# Patient Record
Sex: Female | Born: 1950 | Race: White | Hispanic: No | Marital: Married | Smoking: Never smoker
Health system: Southern US, Community
[De-identification: ages and names within clinical notes are randomized; demographics above are authoritative.]

## PROBLEM LIST (undated history)

## (undated) DIAGNOSIS — R32 Unspecified urinary incontinence: Secondary | ICD-10-CM

## (undated) DIAGNOSIS — R42 Dizziness and giddiness: Secondary | ICD-10-CM

## (undated) DIAGNOSIS — K30 Functional dyspepsia: Secondary | ICD-10-CM

## (undated) DIAGNOSIS — M1990 Unspecified osteoarthritis, unspecified site: Secondary | ICD-10-CM

## (undated) DIAGNOSIS — K219 Gastro-esophageal reflux disease without esophagitis: Secondary | ICD-10-CM

## (undated) DIAGNOSIS — Z9049 Acquired absence of other specified parts of digestive tract: Secondary | ICD-10-CM

## (undated) DIAGNOSIS — G4730 Sleep apnea, unspecified: Secondary | ICD-10-CM

## (undated) HISTORY — DX: Functional dyspepsia: K30

## (undated) HISTORY — PX: SHOULDER ACROMIOPLASTY: SHX6093

## (undated) HISTORY — DX: Dizziness and giddiness: R42

## (undated) HISTORY — PX: WRIST SURGERY: SHX841

## (undated) HISTORY — DX: Unspecified urinary incontinence: R32

## (undated) HISTORY — DX: Sleep apnea, unspecified: G47.30

## (undated) HISTORY — DX: Acquired absence of other specified parts of digestive tract: Z90.49

## (undated) HISTORY — DX: Gastro-esophageal reflux disease without esophagitis: K21.9

## (undated) HISTORY — PX: APPENDECTOMY: SHX54

## (undated) HISTORY — PX: CHOLECYSTECTOMY: SHX55

## (undated) HISTORY — DX: Unspecified osteoarthritis, unspecified site: M19.90

## (undated) HISTORY — PX: KNEE SURGERY: SHX244

---

## 1980-05-27 HISTORY — PX: ABDOMINAL HYSTERECTOMY: SHX81

## 2006-11-12 ENCOUNTER — Ambulatory Visit: Payer: Self-pay | Admitting: Orthopaedic Surgery

## 2006-12-11 ENCOUNTER — Ambulatory Visit: Payer: Self-pay | Admitting: Orthopaedic Surgery

## 2007-02-05 ENCOUNTER — Ambulatory Visit: Payer: Self-pay | Admitting: Internal Medicine

## 2007-02-06 ENCOUNTER — Ambulatory Visit: Payer: Self-pay | Admitting: General Surgery

## 2008-03-13 ENCOUNTER — Emergency Department: Payer: Self-pay | Admitting: Emergency Medicine

## 2008-04-23 ENCOUNTER — Ambulatory Visit: Payer: Self-pay

## 2008-07-04 ENCOUNTER — Ambulatory Visit: Payer: Self-pay | Admitting: Sports Medicine

## 2009-03-23 ENCOUNTER — Ambulatory Visit: Payer: Self-pay | Admitting: Orthopaedic Surgery

## 2009-08-28 ENCOUNTER — Ambulatory Visit: Payer: Self-pay | Admitting: Unknown Physician Specialty

## 2010-05-02 ENCOUNTER — Ambulatory Visit: Payer: Self-pay | Admitting: Specialist

## 2013-07-09 ENCOUNTER — Ambulatory Visit: Payer: Self-pay | Admitting: Physician Assistant

## 2013-07-09 ENCOUNTER — Emergency Department: Payer: Self-pay | Admitting: Emergency Medicine

## 2013-07-09 LAB — COMPREHENSIVE METABOLIC PANEL
ALBUMIN: 4.1 g/dL (ref 3.4–5.0)
ANION GAP: 4 — AB (ref 7–16)
Alkaline Phosphatase: 112 U/L
BILIRUBIN TOTAL: 0.4 mg/dL (ref 0.2–1.0)
BUN: 11 mg/dL (ref 7–18)
CHLORIDE: 105 mmol/L (ref 98–107)
CO2: 30 mmol/L (ref 21–32)
CREATININE: 0.77 mg/dL (ref 0.60–1.30)
Calcium, Total: 9 mg/dL (ref 8.5–10.1)
EGFR (Non-African Amer.): >60
Glucose: 90 mg/dL (ref 65–99)
Osmolality: 276 (ref 275–301)
Potassium: 3.9 mmol/L (ref 3.5–5.1)
SGOT(AST): 24 U/L (ref 15–37)
SGPT (ALT): 35 U/L (ref 12–78)
SODIUM: 139 mmol/L (ref 136–145)
Total Protein: 7.9 g/dL (ref 6.4–8.2)

## 2013-07-09 LAB — CBC
HCT: 41 % (ref 35.0–47.0)
HGB: 14 g/dL (ref 12.0–16.0)
MCH: 31.3 pg (ref 26.0–34.0)
MCHC: 34.3 g/dL (ref 32.0–36.0)
MCV: 91 fL (ref 80–100)
Platelet: 214 10*3/uL (ref 150–440)
RBC: 4.49 10*6/uL (ref 3.80–5.20)
RDW: 13 % (ref 11.5–14.5)
WBC: 7.7 10*3/uL (ref 3.6–11.0)

## 2013-07-09 LAB — TROPONIN I
Troponin-I: <0.02 ng/mL
Troponin-I: <0.02 ng/mL

## 2014-10-28 ENCOUNTER — Ambulatory Visit: Payer: BLUE CROSS/BLUE SHIELD | Attending: Internal Medicine

## 2014-10-28 DIAGNOSIS — G4733 Obstructive sleep apnea (adult) (pediatric): Secondary | ICD-10-CM | POA: Diagnosis not present

## 2014-11-14 ENCOUNTER — Encounter: Payer: Self-pay | Admitting: Family Medicine

## 2014-11-14 ENCOUNTER — Ambulatory Visit (INDEPENDENT_AMBULATORY_CARE_PROVIDER_SITE_OTHER): Payer: BLUE CROSS/BLUE SHIELD | Admitting: Family Medicine

## 2014-11-14 VITALS — BP 153/93 | HR 76 | Temp 98.2°F | Resp 16 | Ht 65.0 in | Wt 195.8 lb

## 2014-11-14 DIAGNOSIS — IMO0001 Reserved for inherently not codable concepts without codable children: Secondary | ICD-10-CM

## 2014-11-14 DIAGNOSIS — R7309 Other abnormal glucose: Secondary | ICD-10-CM | POA: Diagnosis not present

## 2014-11-14 DIAGNOSIS — N3946 Mixed incontinence: Secondary | ICD-10-CM | POA: Diagnosis not present

## 2014-11-14 DIAGNOSIS — R32 Unspecified urinary incontinence: Secondary | ICD-10-CM | POA: Insufficient documentation

## 2014-11-14 DIAGNOSIS — G4733 Obstructive sleep apnea (adult) (pediatric): Secondary | ICD-10-CM | POA: Diagnosis not present

## 2014-11-14 DIAGNOSIS — R030 Elevated blood-pressure reading, without diagnosis of hypertension: Secondary | ICD-10-CM

## 2014-11-14 DIAGNOSIS — E669 Obesity, unspecified: Secondary | ICD-10-CM | POA: Diagnosis not present

## 2014-11-14 DIAGNOSIS — K219 Gastro-esophageal reflux disease without esophagitis: Secondary | ICD-10-CM

## 2014-11-14 DIAGNOSIS — E65 Localized adiposity: Secondary | ICD-10-CM | POA: Insufficient documentation

## 2014-11-14 DIAGNOSIS — F329 Major depressive disorder, single episode, unspecified: Secondary | ICD-10-CM | POA: Insufficient documentation

## 2014-11-14 DIAGNOSIS — Z6832 Body mass index (BMI) 32.0-32.9, adult: Secondary | ICD-10-CM | POA: Insufficient documentation

## 2014-11-14 DIAGNOSIS — F32A Depression, unspecified: Secondary | ICD-10-CM | POA: Insufficient documentation

## 2014-11-14 DIAGNOSIS — M1990 Unspecified osteoarthritis, unspecified site: Secondary | ICD-10-CM | POA: Insufficient documentation

## 2014-11-14 LAB — POCT GLYCOSYLATED HEMOGLOBIN (HGB A1C): HEMOGLOBIN A1C: 5.6

## 2014-11-14 MED ORDER — ESOMEPRAZOLE MAGNESIUM 20 MG PO PACK: 20.0000 mg | PACK | Freq: Every day | ORAL | Status: DC

## 2014-11-14 MED ORDER — OXYBUTYNIN CHLORIDE ER 5 MG PO TB24: 5.0000 mg | ORAL_TABLET | Freq: Every day | ORAL | Status: DC

## 2014-11-14 NOTE — Assessment & Plan Note (Signed)
Symptoms are doing well. Continue Nexium. Alarm symptoms reviewed.

## 2014-11-14 NOTE — Assessment & Plan Note (Signed)
New sleep study completed on 6/3. CPAP titration done but pt needs new machine. Office will correspond with sleep medicine to determine how patient can get new CPAP machine.

## 2014-11-14 NOTE — Progress Notes (Signed)
Subjective:    Patient ID: Amy Mcintosh, female    DOB: 1950-06-02, 64 y.o.   MRN: [id number]  HPI: Amy Mcintosh is a 64 y.o. female presenting on 11/14/2014 for Follow-up; Gastrophageal Reflux; and Sleep Apnea   Gastrophageal Reflux She complains of heartburn. She reports no abdominal pain, no chest pain, no dysphagia, no hoarse voice, no nausea or no sore throat. This is a chronic problem. The current episode started more than 1 year ago. The problem occurs rarely. The problem has been gradually improving. The heartburn does not wake her from sleep. The heartburn does not limit her activity. The heartburn doesn't change with position. The symptoms are aggravated by certain foods. Pertinent negatives include no anemia, melena or weight loss. She has tried a PPI for the symptoms.   Mixed incontinence: Started oxybutin at last visit.  Symptoms have very much improved. She is able to drink at work now without worry. Pt is trying Kegel exercises daily.    Sleep Apnea: Pt had sleep study. She has known sleep apnea but her CPAP is broken. She no longer has the machine. Sleep study included titration. She will need a new prescription for her CPAP.   Elevated BG on last labs. Will check A1c today.    Past Medical History  Diagnosis Date  . Vertigo   . Indigestion   . Incontinence   . GERD (gastroesophageal reflux disease)   . Arthritis   . Sleep apnea   . Hx of appendectomy     No current outpatient prescriptions on file prior to visit.   No current facility-administered medications on file prior to visit.    Review of Systems  Constitutional: Negative for fever, chills and weight loss.  HENT: Negative.  Negative for hoarse voice and sore throat.   Respiratory: Negative for shortness of breath.   Cardiovascular: Negative for chest pain, palpitations and leg swelling.  Gastrointestinal: Positive for heartburn. Negative for dysphagia, nausea, abdominal pain, melena and abdominal  distention.  Endocrine: Negative for cold intolerance, heat intolerance, polydipsia, polyphagia and polyuria.  Genitourinary: Negative.   Musculoskeletal: Negative.   Neurological: Negative for dizziness, numbness and headaches.  Psychiatric/Behavioral: Negative.    Per HPI unless specifically indicated above     Objective:    BP 153/93 mmHg  Pulse 76  Temp(Src) 98.2 F (36.8 C) (Oral)  Resp 16  Ht 5\' 5"  (1.651 m)  Wt 195 lb 12.8 oz (88.814 kg)  BMI 32.58 kg/m2  LMP   Wt Readings from Last 3 Encounters:  11/14/14 195 lb 12.8 oz (88.814 kg)  09/26/14 194 lb 4 oz (88.111 kg)    Physical Exam Results for orders placed or performed in visit on 11/14/14  POCT HgB A1C  Result Value Ref Range   Hemoglobin A1C 5.6       Assessment & Plan:   Problem List Items Addressed This Visit      Respiratory   Obstructive sleep apnea of adult    New sleep study completed on 6/3. CPAP titration done but pt needs new machine. Office will correspond with sleep medicine to determine how patient can get new CPAP machine.       Relevant Orders   For home use only DME continuous positive airway pressure (CPAP)     Digestive   Gastro-esophageal reflux disease without esophagitis - Primary    Symptoms are doing well. Continue Nexium. Alarm symptoms reviewed.       Relevant Medications  esomeprazole (NEXIUM) 20 MG packet     Other   Obesity   Mixed incontinence    Continue oxybutynin daily to help with symptoms. Kegels 3 sets daily to help with stress incontinence. Consider pelvic floor rehab if not improving.       Relevant Medications   oxybutynin (DITROPAN-XL) 5 MG 24 hr tablet    Other Visit Diagnoses    Elevated glucose        A1c is 5.6%- WNL. Likely due to not fasting.     Relevant Orders    POCT HgB A1C (Completed)    Elevated BP        Likely due to stressors at home. Pt will check x3 weeks and follow-up to determine if medication is needed.        Meds ordered this  encounter  Medications  . DISCONTD: esomeprazole (NEXIUM) 20 MG capsule    Sig: TK 1 C PO D    Refill:  11  . DISCONTD: oxybutynin (DITROPAN-XL) 5 MG 24 hr tablet    Sig: TK 1 T PO D    Refill:  11  . DISCONTD: esomeprazole (NEXIUM) 20 MG capsule    Sig: Take by mouth.  . DISCONTD: oxybutynin (DITROPAN-XL) 5 MG 24 hr tablet    Sig: Take by mouth.  . esomeprazole (NEXIUM) 20 MG packet    Sig: Take 20 mg by mouth daily before breakfast.    Dispense:  90 each    Refill:  3    Order Specific Question:  Supervising Provider    Answer:  Janeann Forehand [phone]  . oxybutynin (DITROPAN-XL) 5 MG 24 hr tablet    Sig: Take 1 tablet (5 mg total) by mouth daily.    Dispense:  90 tablet    Refill:  3    Order Specific Question:  Supervising Provider    Answer:  Janeann Forehand [[id number]]      Follow up plan: Return in about 3 weeks (around 12/05/2014), or if symptoms worsen or fail to improve.

## 2014-11-14 NOTE — Patient Instructions (Addendum)
Elevated BP: Likely stress today. Please have your husband check at home and record. We will recheck your BP at your next visit.   CPAP: We will find a DME company to get your CPAP set up and give you a call.

## 2014-11-14 NOTE — Assessment & Plan Note (Signed)
Continue oxybutynin daily to help with symptoms. Kegels 3 sets daily to help with stress incontinence. Consider pelvic floor rehab if not improving.

## 2014-11-17 ENCOUNTER — Telehealth: Payer: Self-pay | Admitting: Family Medicine

## 2014-11-17 NOTE — Telephone Encounter (Signed)
Spoke to [place] she advised that with result a script attached which I need to fax back with Amy's signature and they will work from that point send everything to [employer] and get covered machine for her and they will call her.

## 2014-12-12 ENCOUNTER — Ambulatory Visit (INDEPENDENT_AMBULATORY_CARE_PROVIDER_SITE_OTHER): Payer: BLUE CROSS/BLUE SHIELD | Admitting: Family Medicine

## 2014-12-12 ENCOUNTER — Encounter: Payer: Self-pay | Admitting: Family Medicine

## 2014-12-12 VITALS — BP 132/83 | HR 73 | Temp 98.1°F | Resp 16 | Ht 65.0 in | Wt 195.2 lb

## 2014-12-12 DIAGNOSIS — E669 Obesity, unspecified: Secondary | ICD-10-CM

## 2014-12-12 DIAGNOSIS — K219 Gastro-esophageal reflux disease without esophagitis: Secondary | ICD-10-CM

## 2014-12-12 DIAGNOSIS — Z1239 Encounter for other screening for malignant neoplasm of breast: Secondary | ICD-10-CM

## 2014-12-12 DIAGNOSIS — Z01419 Encounter for gynecological examination (general) (routine) without abnormal findings: Secondary | ICD-10-CM

## 2014-12-12 DIAGNOSIS — Z1211 Encounter for screening for malignant neoplasm of colon: Secondary | ICD-10-CM | POA: Diagnosis not present

## 2014-12-12 MED ORDER — ESOMEPRAZOLE MAGNESIUM 20 MG PO CPDR: 20.0000 mg | DELAYED_RELEASE_CAPSULE | Freq: Every day | ORAL | Status: DC

## 2014-12-12 NOTE — Patient Instructions (Addendum)

## 2014-12-12 NOTE — Progress Notes (Signed)
Subjective:    Patient ID: Amy Mcintosh, female    DOB: 10/31/1950, 63 y.o.   MRN: [id number]  HPI: Amy Mcintosh is a 63 y.o. female presenting on 12/12/2014 for Annual Exam and Gynecologic Exam   Gynecologic Exam The patient's pertinent negatives include no genital itching, genital lesions, genital odor, pelvic pain, vaginal bleeding or vaginal discharge. She is not pregnant. Pertinent negatives include no abdominal pain, back pain, chills, constipation, diarrhea, dysuria, fever, flank pain, frequency, headaches, hematuria, nausea, rash, urgency or vomiting. She uses hysterectomy for contraception. She is postmenopausal. Her past medical history is significant for a gynecological surgery (hysterectomy).    Pt presents for annual exam.  She has a hysterectomy in 1982 in fibroids- she is unsure if they removed the cervix. Pt last had mammogram in 2012- she has a benign breast lesion at 7 o'clock on L breast. It was last imaged in 2012. No other breast changes. Does breast self exams. Wears sunscreen. Has recently restarted her CPAP- she is resting well and feeling less fatigued during the day. Colonoscopy in 2011- per [facility] report she had a 4mm polyp and is due for return in 5 years. Pt is amenable to be referred for colonoscopy today.      Past Medical History  Diagnosis Date  . Vertigo   . Indigestion   . Incontinence   . GERD (gastroesophageal reflux disease)   . Arthritis   . Sleep apnea   . Hx of appendectomy     Current Outpatient Prescriptions on File Prior to Visit  Medication Sig  . oxybutynin (DITROPAN-XL) 5 MG 24 hr tablet Take 1 tablet (5 mg total) by mouth daily.   No current facility-administered medications on file prior to visit.    Review of Systems  Constitutional: Negative for fever, chills and fatigue.  HENT: Negative.   Respiratory: Negative for cough, chest tightness and wheezing.   Cardiovascular: Negative for chest pain and leg swelling.   Gastrointestinal: Negative for nausea, vomiting, abdominal pain, diarrhea and constipation.  Endocrine: Negative.  Negative for cold intolerance, heat intolerance, polydipsia, polyphagia and polyuria.  Genitourinary: Negative for dysuria, urgency, frequency, hematuria, flank pain, vaginal discharge, difficulty urinating and pelvic pain.  Musculoskeletal: Negative.  Negative for back pain.  Skin: Negative for rash.  Neurological: Negative for dizziness, light-headedness, numbness and headaches.  Psychiatric/Behavioral: Negative.    Per HPI unless specifically indicated above     Objective:    BP 132/83 mmHg  Pulse 73  Temp(Src) 98.1 F (36.7 C) (Oral)  Resp 16  Ht  (1.651 m)  Wt 195 lb 3.2 oz (88.542 kg)  BMI 32.48 kg/m2  Wt Readings from Last 3 Encounters:  12/12/14 195 lb 3.2 oz (88.542 kg)  11/14/14 195 lb 12.8 oz (88.814 kg)  09/26/14 194 lb 4 oz (88.111 kg)    Physical Exam  Constitutional: She is oriented to person, place, and time. She appears well-developed and well-nourished.  HENT:  Head: Normocephalic and atraumatic.  Neck: Normal range of motion. Neck supple. No thyromegaly present.  Cardiovascular: Normal rate and regular rhythm.  Exam reveals no gallop and no friction rub.   No murmur heard. Pulmonary/Chest: Breath sounds normal. No respiratory distress. She has no wheezes.  Abdominal: Soft. Bowel sounds are normal. She exhibits no distension. There is no tenderness.  Genitourinary: Vagina normal. No breast swelling, tenderness or discharge. There is no rash or tenderness on the right labia. There is no rash, tenderness or lesion  on the left labia. Right adnexum displays no mass, no tenderness and no fullness. Left adnexum displays no mass, no tenderness and no fullness. No tenderness in the vagina.  Breast nodule at  7 o'oclock on L breast, oval, freely mobile, discrete margins, non-tender.  Cervix and uterus are surgically absent.   Musculoskeletal: Normal  range of motion. She exhibits no edema or tenderness.  Lymphadenopathy:    She has no cervical adenopathy.  Neurological: She is alert and oriented to person, place, and time.  Skin: Skin is warm and dry.  Psychiatric: She has a normal mood and affect. Her behavior is normal. Judgment normal.   Results for orders placed or performed in visit on 11/14/14  POCT HgB A1C  Result Value Ref Range   Hemoglobin A1C 5.6       Assessment & Plan:   Problem List Items Addressed This Visit      Digestive   Gastro-esophageal reflux disease without esophagitis    Doing well. Pt requesting her nexium be changed from powder to capsule.       Relevant Medications   esomeprazole (NEXIUM) 20 MG capsule     Other   Obesity    Discussed healthy eating choices. Referred to [employer]bechoosemyplate.gov for help with food choices. Encouraged 150 minutes of exercise per week (30-6440minutes 3-4x weekly).   Encouraged drinking mainly water.       Other Visit Diagnoses    Well woman exam with routine gynecological exam    -  Primary    Overall doing well. Health maintenance reviewed.    Screening for breast cancer        Refer for [facility]. [facility] given.     Relevant Orders    MM Digital Screening    Screening for colon cancer        [facility] for follow-up of colon polyp found in 2011 colonoscopy by [facility].     Relevant Orders    Ambulatory referral to General Surgery       Meds ordered this encounter  Medications  . esomeprazole (NEXIUM) 20 MG capsule    Sig: Take 1 capsule (20 mg total) by mouth daily at 12 noon.    Dispense:  90 capsule    Refill:  3    Order Specific Question:  Supervising Provider    Answer:  Janeann ForehandHAWKINS JR, JAMES H [[id number]]      Follow up plan: Return in about 6 months (around 06/14/2015) for GERD check. .Marland Kitchen

## 2014-12-12 NOTE — Assessment & Plan Note (Addendum)
Discussed healthy eating choices. Referred to [employer]bechoosemyplate.gov for help with food choices. Encouraged 150 minutes of exercise per week (30-6840minutes 3-4x weekly).   Encouraged drinking mainly water.

## 2014-12-12 NOTE — Assessment & Plan Note (Signed)
Doing well. Pt requesting her nexium be changed from powder to capsule.

## 2014-12-13 ENCOUNTER — Other Ambulatory Visit: Payer: Self-pay

## 2014-12-13 ENCOUNTER — Telehealth: Payer: Self-pay | Admitting: Gastroenterology

## 2014-12-13 NOTE — Telephone Encounter (Signed)
Gastroenterology Pre-Procedure Review  Request Date: 8--29-2016 Requesting Physician: Dr. Servando SnareWOHL  PATIENT REVIEW QUESTIONS: The patient responded to the following health history questions as indicated:    1. Are you having any GI issues? no 2. Do you have a personal history of Polyps? yes (1 small polyp ) 3. Do you have a family history of Colon Cancer or Polyps? no 4. Diabetes Mellitus? no 5. Joint replacements in the past 12 months?no 6. Major health problems in the past 3 months?no 7. Any artificial heart valves, MVP, or defibrillator?no    MEDICATIONS & ALLERGIES:    Patient reports the following regarding taking any anticoagulation/antiplatelet therapy:   Plavix, Coumadin, Eliquis, Xarelto, Lovenox, Pradaxa, Brilinta, or Effient? no Aspirin? no  Patient confirms/reports the following medications:  Current Outpatient Prescriptions  Medication Sig Dispense Refill   esomeprazole (NEXIUM) 20 MG capsule Take 1 capsule (20 mg total) by mouth daily at 12 noon. 90 capsule 3   oxybutynin (DITROPAN-XL) 5 MG 24 hr tablet Take 1 tablet (5 mg total) by mouth daily. 90 tablet 3   No current facility-administered medications for this visit.    Patient confirms/reports the following allergies:  No Known Allergies  No orders of the defined types were placed in this encounter.    AUTHORIZATION INFORMATION Primary Insurance: 1D#: Group #:  Secondary Insurance: 1D#: Group #:  SCHEDULE INFORMATION: Date: 01-23-2015 Time: Location:[facility]

## 2015-01-06 ENCOUNTER — Ambulatory Visit: Payer: BLUE CROSS/BLUE SHIELD | Admitting: Family Medicine

## 2015-01-23 ENCOUNTER — Encounter: Admission: RE | Payer: Self-pay | Source: Ambulatory Visit

## 2015-01-23 ENCOUNTER — Ambulatory Visit
Admission: RE | Admit: 2015-01-23 | Payer: BLUE CROSS/BLUE SHIELD | Source: Ambulatory Visit | Admitting: Gastroenterology

## 2015-01-23 SURGERY — COLONOSCOPY WITH PROPOFOL
Anesthesia: Choice

## 2017-09-09 ENCOUNTER — Ambulatory Visit (INDEPENDENT_AMBULATORY_CARE_PROVIDER_SITE_OTHER): Payer: Medicare HMO | Admitting: Nurse Practitioner

## 2017-09-09 ENCOUNTER — Other Ambulatory Visit: Payer: Self-pay

## 2017-09-09 ENCOUNTER — Encounter: Payer: Self-pay | Admitting: Nurse Practitioner

## 2017-09-09 VITALS — BP 136/71 | HR 76 | Temp 98.4°F | Ht 65.0 in | Wt 194.6 lb

## 2017-09-09 DIAGNOSIS — R413 Other amnesia: Secondary | ICD-10-CM

## 2017-09-09 DIAGNOSIS — Z136 Encounter for screening for cardiovascular disorders: Secondary | ICD-10-CM | POA: Diagnosis not present

## 2017-09-09 DIAGNOSIS — R7309 Other abnormal glucose: Secondary | ICD-10-CM | POA: Diagnosis not present

## 2017-09-09 DIAGNOSIS — Z833 Family history of diabetes mellitus: Secondary | ICD-10-CM

## 2017-09-09 DIAGNOSIS — G4733 Obstructive sleep apnea (adult) (pediatric): Secondary | ICD-10-CM

## 2017-09-09 NOTE — Patient Instructions (Addendum)
Amy BathGlenda K Mcintosh,   Thank you for coming in to clinic today.  1. MMSE - mini mental status exam and clock draw test today = normal today. - Get a good night's rest for a couple weeks and see if memory gets better.  We will rescreen in 6 months for any changes or worsening.  2. Labs today - we will call you with results in a couple days.  Please schedule a follow-up appointment with Wilhelmina McardleLauren Lucine Bilski, AGNP. Return in about 6 months (around 03/11/2018) for Medicare wellness in 6 months with MMSE.  If you have any other questions or concerns, please feel free to call the clinic or send a message through MyChart. You may also schedule an earlier appointment if necessary.  You will receive a survey after today's visit either digitally by e-mail or paper by [employer] mail. Your experiences and feedback matter to us.  Please respond so we know how we are doing as we provide care for you.   Wilhelmina McardleLauren Jahni Nazar, DNP, AGNP-BC Adult Gerontology Nurse Practitioner [facility]

## 2017-09-09 NOTE — Progress Notes (Signed)
Subjective:    Patient ID: Amy Mcintosh, female    DOB: [birth year], 67 y.o.   MRN: [id number]  Amy Mcintosh is a 67 y.o. female presenting on 09/09/2017 for Apnea (need a order for CPAP new machine, pt need an order for replacement filters )   HPI Obstructive sleep apnea - Needs filters for replacement. Changes filters once per month.  Has new machine after last sleep study June 2016.  Current machine is working well and does not need a replacement.  Needs replacement supplies for mask, tubing also.  Pt reports a faxed order from her supply company was sent about 1 month ago.   - She sleeps well when wearing her CPAP. Wears for 6-7 hours when sleeping.  Dozes some in living room without CPAP nightly. - Is not using CPAP now r/t not having replacement filters.  Is waking up with headache that improves after being awake, but when wearing CPAP has no headache. - Also having increasing forgetfulness and clumsiness.  Has hit hand yesterday and broke two glasses.  Frustrated with memory and not being able to remember common things throughout the day.  Current supplies Resmed AirFit P10 Nasal Pillow, Small apparatus  Family history DM - Hasn't felt normal in last couple weeks and states her husband has checekd her sugar and it was 140.  Pt has family history of diabetes and would like screening.  GERD Ranitidine prn if reflux.  Social History   Tobacco Use  . Smoking status: Never Smoker  . Smokeless tobacco: Never Used  Substance Use Topics  . Alcohol use: No  . Drug use: No    Review of Systems Per HPI unless specifically indicated above     Objective:    BP 136/71 (BP Location: Right Arm, Patient Position: Sitting, Cuff Size: Normal)   Pulse 76   Temp 98.4 F (36.9 C) (Oral)   Ht 5\' 5"  (1.651 m)   Wt 194 lb 9.6 oz (88.3 kg)   BMI 32.38 kg/m   Wt Readings from Last 3 Encounters:  09/09/17 194 lb 9.6 oz (88.3 kg)  12/12/14 195 lb 3.2 oz (88.5 kg)  11/14/14 195 lb 12.8  oz (88.8 kg)    Physical Exam  Constitutional: She is oriented to person, place, and time. She appears well-developed and well-nourished. No distress.  HENT:  Head: Normocephalic and atraumatic.  Neck: Normal range of motion. Neck supple. Carotid bruit is not present.  Cardiovascular: Normal rate, regular rhythm, S1 normal, S2 normal, normal heart sounds and intact distal pulses.  Pulmonary/Chest: Effort normal and breath sounds normal. No respiratory distress.  Musculoskeletal: She exhibits no edema (pedal).  Neurological: She is alert and oriented to person, place, and time.  Skin: Skin is warm and dry.  Psychiatric: Her speech is normal and behavior is normal. Judgment and thought content normal. Cognition and memory are normal. Cognition and memory are not impaired. She exhibits a depressed mood (tearful). She exhibits normal recent memory and normal remote memory.  Vitals reviewed.   Normal Clock Draw test  MMSE - Mini Mental State Exam 09/09/2017  Orientation to time 5  Orientation to Place 4  Registration 3  Attention/ Calculation 5  Recall 3  Language- name 2 objects 2  Language- repeat 0  Language- follow 3 step command 3  Language- read & follow direction 1  Write a sentence 1  Copy design 1  Total score 28     Results for orders placed or  performed in visit on 11/14/14  POCT HgB A1C  Result Value Ref Range   Hemoglobin A1C 5.6       Assessment & Plan:   Problem List Items Addressed This Visit    None    Visit Diagnoses    Obstructive sleep apnea    -  Primary Pt with stable OSA on CPAP for 6-7 hours each night with good results.  Now is not using CPAP r/t needing replacement filters and is beginning to have symptoms of daytime sleepiness, forgetfulness, and clumsiness.  Pt is also awakening with headache that resolves after 30-60 minutes.  Plan: 1. Provide order for replacement parts to maintain CPAP machine. NO new machine is needed. 2. Encouraged pt to  get good sleep and re-evaulate other symptoms. 3. Followup as needed in 6 months with repeat MMSE.    Memory changes     Pt notes memory impairment and difficulty with word recall throughout the day.  Normal cognitive testing with MMSE and clock draw test today.  Plan: 1. Repeat MMSE in 6 months. 2. Encourage good sleep and use of CPAP for improving cognition. 3. Followup 6 months.   Relevant Orders   Hemoglobin A1c   COMPLETE METABOLIC PANEL WITH GFR   Encounter for screening for cardiovascular disorders     Pt with elevated glucose at home.  Would like screening for lipid disorder and diabetes since has family history of both.  Labs today for A1c, lipid, and cmp.   Relevant Orders   Lipid panel   Family history of diabetes mellitus       Relevant Orders   Hemoglobin A1c   Elevated glucose       Relevant Orders   Hemoglobin A1c   COMPLETE METABOLIC PANEL WITH GFR      Follow up plan: Return in about 6 months (around 03/11/2018) for Medicare wellness in 6 months with MMSE.  Wilhelmina Mcardle, DNP, AGPCNP-BC Adult Gerontology Primary Care Nurse Practitioner [facility] 09/09/2017, 5:19 PM

## 2017-09-10 ENCOUNTER — Encounter: Payer: Self-pay | Admitting: Nurse Practitioner

## 2017-09-10 DIAGNOSIS — E782 Mixed hyperlipidemia: Secondary | ICD-10-CM | POA: Insufficient documentation

## 2017-09-10 DIAGNOSIS — R748 Abnormal levels of other serum enzymes: Secondary | ICD-10-CM | POA: Insufficient documentation

## 2017-09-10 DIAGNOSIS — R7303 Prediabetes: Secondary | ICD-10-CM | POA: Insufficient documentation

## 2017-09-10 LAB — COMPLETE METABOLIC PANEL WITH GFR
AG Ratio: 1.6 (calc) (ref 1.0–2.5)
ALT: 53 U/L — ABNORMAL HIGH (ref 6–29)
AST: 61 U/L — ABNORMAL HIGH (ref 10–35)
Albumin: 4.3 g/dL (ref 3.6–5.1)
Alkaline phosphatase (APISO): 94 U/L (ref 33–130)
BUN: 11 mg/dL (ref 7–25)
CO2: 28 mmol/L (ref 20–32)
Calcium: 9.2 mg/dL (ref 8.6–10.4)
Chloride: 106 mmol/L (ref 98–110)
Creat: 0.78 mg/dL (ref 0.50–0.99)
GFR, Est African American: 92 mL/min/{1.73_m2} (ref >=60)
GFR, Est Non African American: 79 mL/min/{1.73_m2} (ref >=60)
Globulin: 2.7 g/dL (calc) (ref 1.9–3.7)
Glucose, Bld: 114 mg/dL — ABNORMAL HIGH (ref 65–99)
Potassium: 4.1 mmol/L (ref 3.5–5.3)
Sodium: 140 mmol/L (ref 135–146)
Total Bilirubin: 0.6 mg/dL (ref 0.2–1.2)
Total Protein: 7 g/dL (ref 6.1–8.1)

## 2017-09-10 LAB — LIPID PANEL
Cholesterol: 197 mg/dL (ref <200)
HDL: 40 mg/dL — ABNORMAL LOW (ref >50)
LDL Cholesterol (Calc): 133 mg/dL (calc) — ABNORMAL HIGH
Non-HDL Cholesterol (Calc): 157 mg/dL (calc) — ABNORMAL HIGH (ref <130)
Total CHOL/HDL Ratio: 4.9 (calc) (ref <5.0)
Triglycerides: 125 mg/dL (ref <150)

## 2017-09-10 LAB — HEMOGLOBIN A1C
Hgb A1c MFr Bld: 6 % of total Hgb — ABNORMAL HIGH (ref <5.7)
Mean Plasma Glucose: 126 (calc)
eAG (mmol/L): 7 (calc)

## 2017-11-20 DIAGNOSIS — G4733 Obstructive sleep apnea (adult) (pediatric): Secondary | ICD-10-CM | POA: Diagnosis not present

## 2018-03-08 DIAGNOSIS — H6691 Otitis media, unspecified, right ear: Secondary | ICD-10-CM | POA: Diagnosis not present

## 2018-03-08 DIAGNOSIS — J0100 Acute maxillary sinusitis, unspecified: Secondary | ICD-10-CM | POA: Diagnosis not present

## 2018-03-17 ENCOUNTER — Ambulatory Visit (INDEPENDENT_AMBULATORY_CARE_PROVIDER_SITE_OTHER): Payer: Medicare HMO

## 2018-03-17 VITALS — BP 152/82 | HR 84 | Temp 98.6°F | Resp 16 | Ht 65.0 in | Wt 197.0 lb

## 2018-03-17 DIAGNOSIS — Z1239 Encounter for other screening for malignant neoplasm of breast: Secondary | ICD-10-CM

## 2018-03-17 DIAGNOSIS — Z0000 Encounter for general adult medical examination without abnormal findings: Secondary | ICD-10-CM | POA: Diagnosis not present

## 2018-03-17 DIAGNOSIS — Z780 Asymptomatic menopausal state: Secondary | ICD-10-CM | POA: Diagnosis not present

## 2018-03-17 NOTE — Patient Instructions (Addendum)
Amy Mcintosh , Thank you for taking time to come for your Medicare Wellness Visit. I appreciate your ongoing commitment to your health goals. Please review the following plan we discussed and let me know if I can assist you in the future.   Screening recommendations/referrals: Colonoscopy: completed 05/2009 Mammogram: Please call [phone] to schedule your mammogram.  Bone Density: Please call [phone] to schedule with your mammogram Recommended yearly ophthalmology/optometry visit for glaucoma screening and checkup Recommended yearly dental visit for hygiene and checkup  Vaccinations: Influenza vaccine: declined Pneumococcal vaccine: declined Tdap vaccine: due, check with your insurance company for coverage Shingles vaccine: shingrix eligible, check with your insurance company for coverage   Advanced directives: Advance directive discussed with you today. Even though you declined this today please call our office should you change your mind and we can give you the proper paperwork for you to fill out.  Conditions/risks identified: Recommend drinking at least 6-8 glasses of water a day   Next appointment: Follow up in one year for your annual wellness exam.     [facility] Insurance Information Program [email or web address]     Preventive Care 65 Years and Older, Female Preventive care refers to lifestyle choices and visits with your health care provider that can promote health and wellness. What does preventive care include?  A yearly physical exam. This is also called an annual well check.  Dental exams once or twice a year.  Routine eye exams. Ask your health care provider how often you should have your eyes checked.  Personal lifestyle choices, including:  Daily care of your teeth and gums.  Regular physical activity.  Eating a healthy diet.  Avoiding tobacco and drug use.  Limiting alcohol  use.  Practicing safe sex.  Taking low-dose aspirin every day.  Taking vitamin and mineral supplements as recommended by your health care provider. What happens during an annual well check? The services and screenings done by your health care provider during your annual well check will depend on your age, overall health, lifestyle risk factors, and family history of disease. Counseling  Your health care provider may ask you questions about your:  Alcohol use.  Tobacco use.  Drug use.  Emotional well-being.  Home and relationship well-being.  Sexual activity.  Eating habits.  History of falls.  Memory and ability to understand (cognition).  Work and work Astronomer.  Reproductive health. Screening  You may have the following tests or measurements:  Height, weight, and BMI.  Blood pressure.  Lipid and cholesterol levels. These may be checked every 5 years, or more frequently if you are over 19 years old.  Skin check.  Lung cancer screening. You may have this screening every year starting at age 17 if you have a 30-pack-year history of smoking and currently smoke or have quit within the past 15 years.  Fecal occult blood test (FOBT) of the stool. You may have this test every year starting at age 51.  Flexible sigmoidoscopy or colonoscopy. You may have a sigmoidoscopy every 5 years or a colonoscopy every 10 years starting at age 76.  Hepatitis C blood test.  Hepatitis B blood test.  Sexually transmitted disease (STD) testing.  Diabetes screening. This is done by checking your blood sugar (glucose) after you have not eaten for a while (fasting). You may have this done every 1-3 years.  Bone density scan. This is done to screen for osteoporosis. You may have this done starting at age 65.  Mammogram. This may  be done every 1-2 years. Talk to your health care provider about how often you should have regular mammograms. Talk with your health care provider about  your test results, treatment options, and if necessary, the need for more tests. Vaccines  Your health care provider may recommend certain vaccines, such as:  Influenza vaccine. This is recommended every year.  Tetanus, diphtheria, and acellular pertussis (Tdap, Td) vaccine. You may need a Td booster every 10 years.  Zoster vaccine. You may need this after age 66.  Pneumococcal 13-valent conjugate (PCV13) vaccine. One dose is recommended after age 55.  Pneumococcal polysaccharide (PPSV23) vaccine. One dose is recommended after age 64. Talk to your health care provider about which screenings and vaccines you need and how often you need them. This information is not intended to replace advice given to you by your health care provider. Make sure you discuss any questions you have with your health care provider. Document Released: 06/09/2015 Document Revised: 01/31/2016 Document Reviewed: 03/14/2015 Elsevier Interactive Patient Education  2017 [employer].  Fall Prevention in the Home Falls can cause injuries. They can happen to people of all ages. There are many things you can do to make your home safe and to help prevent falls. What can I do on the outside of my home?  Regularly fix the edges of walkways and driveways and fix any cracks.  Remove anything that might make you trip as you walk through a door, such as a raised step or threshold.  Trim any bushes or trees on the path to your home.  Use bright outdoor lighting.  Clear any walking paths of anything that might make someone trip, such as rocks or tools.  Regularly check to see if handrails are loose or broken. Make sure that both sides of any steps have handrails.  Any raised decks and porches should have guardrails on the edges.  Have any leaves, snow, or ice cleared regularly.  Use sand or salt on walking paths during winter.  Clean up any spills in your garage right away. This includes oil or grease spills. What  can I do in the bathroom?  Use night lights.  Install grab bars by the toilet and in the tub and shower. Do not use towel bars as grab bars.  Use non-skid mats or decals in the tub or shower.  If you need to sit down in the shower, use a plastic, non-slip stool.  Keep the floor dry. Clean up any water that spills on the floor as soon as it happens.  Remove soap buildup in the tub or shower regularly.  Attach bath mats securely with double-sided non-slip rug tape.  Do not have throw rugs and other things on the floor that can make you trip. What can I do in the bedroom?  Use night lights.  Make sure that you have a light by your bed that is easy to reach.  Do not use any sheets or blankets that are too big for your bed. They should not hang down onto the floor.  Have a firm chair that has side arms. You can use this for support while you get dressed.  Do not have throw rugs and other things on the floor that can make you trip. What can I do in the kitchen?  Clean up any spills right away.  Avoid walking on wet floors.  Keep items that you use a lot in easy-to-reach places.  If you need to reach something above you,  use a strong step stool that has a grab bar.  Keep electrical cords out of the way.  Do not use floor polish or wax that makes floors slippery. If you must use wax, use non-skid floor wax.  Do not have throw rugs and other things on the floor that can make you trip. What can I do with my stairs?  Do not leave any items on the stairs.  Make sure that there are handrails on both sides of the stairs and use them. Fix handrails that are broken or loose. Make sure that handrails are as long as the stairways.  Check any carpeting to make sure that it is firmly attached to the stairs. Fix any carpet that is loose or worn.  Avoid having throw rugs at the top or bottom of the stairs. If you do have throw rugs, attach them to the floor with carpet tape.  Make sure  that you have a light switch at the top of the stairs and the bottom of the stairs. If you do not have them, ask someone to add them for you. What else can I do to help prevent falls?  Wear shoes that:  Do not have high heels.  Have rubber bottoms.  Are comfortable and fit you well.  Are closed at the toe. Do not wear sandals.  If you use a stepladder:  Make sure that it is fully opened. Do not climb a closed stepladder.  Make sure that both sides of the stepladder are locked into place.  Ask someone to hold it for you, if possible.  Clearly mark and make sure that you can see:  Any grab bars or handrails.  First and last steps.  Where the edge of each step is.  Use tools that help you move around (mobility aids) if they are needed. These include:  Canes.  Walkers.  Scooters.  Crutches.  Turn on the lights when you go into a dark area. Replace any light bulbs as soon as they burn out.  Set up your furniture so you have a clear path. Avoid moving your furniture around.  If any of your floors are uneven, fix them.  If there are any pets around you, be aware of where they are.  Review your medicines with your doctor. Some medicines can make you feel dizzy. This can increase your chance of falling. Ask your doctor what other things that you can do to help prevent falls. This information is not intended to replace advice given to you by your health care provider. Make sure you discuss any questions you have with your health care provider. Document Released: 03/09/2009 Document Revised: 10/19/2015 Document Reviewed: 06/17/2014 Elsevier Interactive Patient Education  2017 [employer].

## 2018-03-17 NOTE — Progress Notes (Signed)
Subjective:   Amy Mcintosh is a 67 y.o. female who presents for an Initial Medicare Annual Wellness Visit.  Review of Systems    Cardiac Risk Factors include: advanced age (>64men, >56 women);obesity (BMI >30kg/m2)     Objective:    Today's Vitals   03/17/18 0826  BP: (!) 152/82  Pulse: 84  Resp: 16  Temp: 98.6 F (37 C)  TempSrc: Oral  Weight: 197 lb (89.4 kg)  Height: 5\' 5"  (1.651 m)   Body mass index is 32.78 kg/m.  Advanced Directives 03/17/2018  Does Patient Have a Medical Advance Directive? No  Would patient like information on creating a medical advance directive? No - Patient declined    Current Medications (verified) Outpatient Encounter Medications as of 03/17/2018  Medication Sig  . cefdinir (OMNICEF) 300 MG capsule TK 1 C PO Q 12 H  . ranitidine (ZANTAC) 75 MG tablet Take 75 mg by mouth 2 (two) times daily as needed for heartburn.   No facility-administered encounter medications on file as of 03/17/2018.     Allergies (verified) Patient has no known allergies.   History: Past Medical History:  Diagnosis Date  . Arthritis   . GERD (gastroesophageal reflux disease)   . Hx of appendectomy   . Incontinence   . Indigestion   . Sleep apnea   . Vertigo    Past Surgical History:  Procedure Laterality Date  . ABDOMINAL HYSTERECTOMY  1982  . APPENDECTOMY    . CHOLECYSTECTOMY    . KNEE SURGERY Right   . SHOULDER ACROMIOPLASTY    . WRIST SURGERY Right    right and left wrist surgery    Family History  Problem Relation Age of Onset  . Liver disease Father   . Breast cancer Sister   . Bladder Cancer Sister   . Pancreatic cancer Paternal Aunt   . Breast cancer Paternal Aunt   . Breast cancer Sister    Social History   Socioeconomic History  . Marital status: Married    Spouse name: Not on file  . Number of children: Not on file  . Years of education: Not on file  . Highest education level: High school graduate  Occupational History  .  Occupation: Statistician    Comment: part time   Social Needs  . Financial resource strain: Not very hard  . Food insecurity:    Worry: Never true    Inability: Never true  . Transportation needs:    Medical: No    Non-medical: No  Tobacco Use  . Smoking status: Never Smoker  . Smokeless tobacco: Never Used  Substance and Sexual Activity  . Alcohol use: No  . Drug use: No  . Sexual activity: Not on file  Lifestyle  . Physical activity:    Days per week: 0 days    Minutes per session: 0 min  . Stress: Not at all  Relationships  . Social connections:    Talks on phone: More than three times a week    Gets together: More than three times a week    Attends religious service: More than 4 times per year    Active member of club or organization: No    Attends meetings of clubs or organizations: Never    Relationship status: Married  Other Topics Concern  . Not on file  Social History Narrative   Works 3 times a week   Church 3 times a week     Tobacco Counseling Counseling  given: Not Answered   Clinical Intake:  Pre-visit preparation completed: Yes  Pain : No/denies pain     Nutritional Status: BMI > 30  Obese Nutritional Risks: None Diabetes: No  How often do you need to have someone help you when you read instructions, pamphlets, or other written materials from your doctor or pharmacy?: 1 - Never What is the last grade level you completed in school?: high school  Interpreter Needed?: No  Information entered by :: Shakeera Rightmyer,LPN    Activities of Daily Living In your present state of health, do you have any difficulty performing the following activities: 03/17/2018  Hearing? N  Comment declines hearing aids   Vision? N  Comment wears glasses  Difficulty concentrating or making decisions? N  Walking or climbing stairs? N  Dressing or bathing? N  Doing errands, shopping? N  Preparing Food and eating ? N  Using the Toilet? N  In the past six months, have  you accidently leaked urine? Y  Comment wears pads for protection   Do you have problems with loss of bowel control? N  Managing your Medications? N  Managing your Finances? N  Housekeeping or managing your Housekeeping? N  Some recent data might be hidden     Immunizations and Health Maintenance Immunization History  Administered Date(s) Administered  . Tdap 05/28/2007   Health Maintenance Due  Topic Date Due  . Hepatitis C Screening  Jul 10, 1950  . MAMMOGRAM  12/12/2011  . DEXA SCAN  02/11/2016    Patient Care Team: Galen Manila, NP as PCP - General (Nurse Practitioner)  Indicate any recent Medical Services you may have received from other than [facility] providers in the past year (date may be approximate).     Assessment:   This is a routine wellness examination for Amy Mcintosh.  Hearing/Vision screen Vision Screening Comments: Sees Dr.Shade annually   Dietary issues and exercise activities discussed: Current Exercise Habits: The patient does not participate in regular exercise at present, Exercise limited by: None identified  Goals    . DIET - INCREASE WATER INTAKE     Recommend drinking at least 6-8 glasses of water a day       Depression Screen PHQ 2/9 Scores 03/17/2018  PHQ - 2 Score 0    Fall Risk Fall Risk  03/17/2018  Falls in the past year? No    FALL RISK PREVENTION PERTAINING TO THE HOME:  Any stairs in or around the home WITH handrails? No  Home free of loose throw rugs in walkways, pet beds, electrical cords, etc? Yes  Adequate lighting in your home to reduce risk of falls? Yes   ASSISTIVE DEVICES UTILIZED TO PREVENT FALLS:  Life alert? No  Use of a cane, walker or w/c? No  Grab bars in the bathroom? No  Shower chair or bench in shower? Yes  Elevated toilet seat or a handicapped toilet? Yes   DME ORDERS:  DME order needed?  No   TIMED UP AND GO:  Was the test performed? yes Length of time to ambulate 10 feet: 8 sec.    GAIT:  Appearance of gait: Gait stead-fast without the use of an assistive device.  Education: Fall risk prevention has been discussed.  Intervention(s) required? No    Cognitive Function: MMSE - Mini Mental State Exam 09/09/2017  Orientation to time 5  Orientation to Place 4  Registration 3  Attention/ Calculation 5  Recall 3  Language- name 2 objects 2  Language- repeat  0  Language- follow 3 step command 3  Language- read & follow direction 1  Write a sentence 1  Copy design 1  Total score 28        Screening Tests Health Maintenance  Topic Date Due  . Hepatitis C Screening  March 05, 1951  . MAMMOGRAM  12/12/2011  . DEXA SCAN  02/11/2016  . INFLUENZA VACCINE  01/26/2019 (Originally 12/25/2017)  . TETANUS/TDAP  03/18/2019 (Originally 05/27/2017)  . PNA vac Low Risk Adult (1 of 2 - PCV13) 03/18/2019 (Originally 02/11/2016)  . COLONOSCOPY  12/12/2019    Qualifies for Shingles Vaccine? Yes  Due for Shingrix. Education has been provided regarding the importance of this vaccine. Pt has been advised to call insurance company to determine out of pocket expense. Advised may also receive vaccine at local pharmacy or Health Dept. Verbalized acceptance and understanding.  Tdap: Although this vaccine is not a covered service during a Wellness Exam, does the patient still wish to receive this vaccine today?  No .  Education has been provided regarding the importance of this vaccine. Advised may receive this vaccine at local pharmacy or Health Dept. Aware to provide a copy of the vaccination record if obtained from local pharmacy or Health Dept. Verbalized acceptance and understanding.  Flu Vaccine: Due for Flu vaccine. Does the patient want to receive this vaccine today?  No . Education has been provided regarding the importance of this vaccine but still declined. Advised may receive this vaccine at local pharmacy or Health Dept. Aware to provide a copy of the vaccination record if obtained  from local pharmacy or Health Dept. Verbalized acceptance and understanding.  Pneumococcal Vaccine: Due for Pneumococcal vaccine. Does the patient want to receive this vaccine today?  No . Education has been provided regarding the importance of this vaccine but still declined. Advised may receive this vaccine at local pharmacy or Health Dept. Aware to provide a copy of the vaccination record if obtained from local pharmacy or Health Dept. Verbalized acceptance and understanding.  Cancer Screenings:  Colorectal Screening: Completed 11/2009. Repeat every 10 years  Mammogram: due, Ordered today. Pt provided with contact info and advised to call to schedule appt.  Bone Density: Ordered today. Pt provided with contact info and advised to call to schedule appt.   Lung Cancer Screening: (Low Dose CT Chest recommended if Age 51-80 years, 30 pack-year currently smoking OR have quit w/in 15years.) does not qualify.     Additional Screening:  Hepatitis C Screening: does qualify, will order for next lab draw  Vision Screening: Recommended annual ophthalmology exams for early detection of glaucoma and other disorders of the eye. Is the patient up to date with their annual eye exam?  Yes  Who is the provider or what is the name of the office in which the pt attends annual eye exams? Dr.Shade  Dental Screening: Recommended annual dental exams for proper oral hygiene  Community Resource Referral:  CRR required this visit?  No  provided information on [facility] and [facility] medication list for her family as she mentioned this was a financial concerns of hers.      Plan:    I have personally reviewed and addressed the Medicare Annual Wellness questionnaire and have noted the following in the patient's chart:  A. Medical and social history B. Use of alcohol, tobacco or illicit drugs  C. Current medications and supplements D. Functional ability and status E.  Nutritional status F.  Physical  activity G. Advance directives H.  List of other physicians I.  Hospitalizations, surgeries, and ER visits in previous 12 months J.  Vitals K. Screenings such as hearing and vision if needed, cognitive and depression L. Referrals and appointments   In addition, I have reviewed and discussed with patient certain preventive protocols, quality metrics, and best practice recommendations. A written personalized care plan for preventive services as well as general preventive health recommendations were provided to patient.   Signed,  Marin Roberts, LPN Nurse Health Advisor   Nurse Notes:none

## 2018-04-28 ENCOUNTER — Ambulatory Visit
Admission: RE | Admit: 2018-04-28 | Discharge: 2018-04-28 | Disposition: A | Discharge status: Home / Self Care | Payer: Medicare HMO | Source: Ambulatory Visit | Attending: Family Medicine | Admitting: Family Medicine

## 2018-04-28 DIAGNOSIS — Z780 Asymptomatic menopausal state: Secondary | ICD-10-CM | POA: Diagnosis not present

## 2018-04-28 DIAGNOSIS — M810 Age-related osteoporosis without current pathological fracture: Secondary | ICD-10-CM | POA: Insufficient documentation

## 2018-04-28 DIAGNOSIS — R928 Other abnormal and inconclusive findings on diagnostic imaging of breast: Secondary | ICD-10-CM | POA: Insufficient documentation

## 2018-04-28 DIAGNOSIS — Z1231 Encounter for screening mammogram for malignant neoplasm of breast: Secondary | ICD-10-CM | POA: Insufficient documentation

## 2018-04-28 DIAGNOSIS — Z1239 Encounter for other screening for malignant neoplasm of breast: Secondary | ICD-10-CM

## 2018-04-28 DIAGNOSIS — Z13820 Encounter for screening for osteoporosis: Secondary | ICD-10-CM | POA: Diagnosis not present

## 2018-04-28 DIAGNOSIS — M8588 Other specified disorders of bone density and structure, other site: Secondary | ICD-10-CM | POA: Diagnosis not present

## 2018-04-29 ENCOUNTER — Other Ambulatory Visit: Payer: Self-pay | Admitting: Family Medicine

## 2018-04-29 DIAGNOSIS — N6310 Unspecified lump in the right breast, unspecified quadrant: Secondary | ICD-10-CM

## 2018-04-29 DIAGNOSIS — R928 Other abnormal and inconclusive findings on diagnostic imaging of breast: Secondary | ICD-10-CM

## 2018-04-30 ENCOUNTER — Encounter: Payer: Self-pay | Admitting: Family Medicine

## 2018-04-30 DIAGNOSIS — M810 Age-related osteoporosis without current pathological fracture: Secondary | ICD-10-CM | POA: Insufficient documentation

## 2018-05-11 ENCOUNTER — Ambulatory Visit
Admission: RE | Admit: 2018-05-11 | Discharge: 2018-05-11 | Disposition: A | Discharge status: Home / Self Care | Payer: Medicare HMO | Source: Ambulatory Visit | Attending: Family Medicine | Admitting: Family Medicine

## 2018-05-11 DIAGNOSIS — N6314 Unspecified lump in the right breast, lower inner quadrant: Secondary | ICD-10-CM | POA: Diagnosis not present

## 2018-05-11 DIAGNOSIS — R928 Other abnormal and inconclusive findings on diagnostic imaging of breast: Secondary | ICD-10-CM

## 2018-05-11 DIAGNOSIS — N6310 Unspecified lump in the right breast, unspecified quadrant: Secondary | ICD-10-CM

## 2018-05-12 ENCOUNTER — Other Ambulatory Visit: Payer: Self-pay | Admitting: Family Medicine

## 2018-05-12 DIAGNOSIS — R928 Other abnormal and inconclusive findings on diagnostic imaging of breast: Secondary | ICD-10-CM

## 2018-06-23 ENCOUNTER — Ambulatory Visit: Payer: Medicare HMO | Admitting: Nurse Practitioner

## 2018-06-30 ENCOUNTER — Other Ambulatory Visit: Payer: Self-pay

## 2018-06-30 ENCOUNTER — Ambulatory Visit (INDEPENDENT_AMBULATORY_CARE_PROVIDER_SITE_OTHER): Payer: Medicare HMO | Admitting: Nurse Practitioner

## 2018-06-30 ENCOUNTER — Encounter: Payer: Self-pay | Admitting: Nurse Practitioner

## 2018-06-30 VITALS — BP 150/74 | HR 69 | Temp 97.9°F | Ht 65.0 in | Wt 197.6 lb

## 2018-06-30 DIAGNOSIS — M810 Age-related osteoporosis without current pathological fracture: Secondary | ICD-10-CM | POA: Diagnosis not present

## 2018-06-30 DIAGNOSIS — K219 Gastro-esophageal reflux disease without esophagitis: Secondary | ICD-10-CM | POA: Diagnosis not present

## 2018-06-30 DIAGNOSIS — R7303 Prediabetes: Secondary | ICD-10-CM | POA: Diagnosis not present

## 2018-06-30 DIAGNOSIS — R748 Abnormal levels of other serum enzymes: Secondary | ICD-10-CM | POA: Diagnosis not present

## 2018-06-30 DIAGNOSIS — N3946 Mixed incontinence: Secondary | ICD-10-CM

## 2018-06-30 DIAGNOSIS — N6459 Other signs and symptoms in breast: Secondary | ICD-10-CM | POA: Diagnosis not present

## 2018-06-30 DIAGNOSIS — E782 Mixed hyperlipidemia: Secondary | ICD-10-CM

## 2018-06-30 MED ORDER — ESOMEPRAZOLE MAGNESIUM 20 MG PO CPDR: 20.0000 mg | DELAYED_RELEASE_CAPSULE | Freq: Every day | ORAL | 3 refills | Status: DC

## 2018-06-30 MED ORDER — OXYBUTYNIN CHLORIDE ER 5 MG PO TB24: 5.0000 mg | ORAL_TABLET | Freq: Every day | ORAL | 1 refills | Status: DC
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
## 2018-06-30 NOTE — Patient Instructions (Addendum)
Amy Mcintosh,   Thank you for coming in to clinic today.  1.  Sources of calcium are in Dairy: milk, cheese; Nuts: almond milk  2. Use a supplement of 500-600 mg calcium with meals you do not eat dietary calcium source.   - TOTAL goal is 1500 mg per day.  3. GET a dental evaluation for oral health prior to starting osteoporosis medication like Fosamax/alendronate. - Request faxed copy of report to our office [phone] (fax)  Please schedule a follow-up appointment with Wilhelmina Mcardle, AGNP. Return in about 6 months (around 12/29/2018) for GERD, osteoporosis, pre-diabetes.  If you have any other questions or concerns, please feel free to call the clinic or send a message through MyChart. You may also schedule an earlier appointment if necessary.  You will receive a survey after today's visit either digitally by e-mail or paper by [employer]. Your experiences and feedback matter to [place].  Please respond so we know how we are doing as we provide care for you.   Wilhelmina Mcardle, DNP, AGNP-BC Adult Gerontology Nurse Practitioner [facility], [facility]   Low Back Pain Exercises See other page with pictures of each exercise.  Start with 1 or 2 of these exercises that you are most comfortable with. Do not do any exercises that cause you significant worsening pain. Some of these may cause some "stretching soreness" but it should go away after you stop the exercise, and get better over time. Gradually increase up to 3-4 exercises as tolerated.  Standing hamstring stretch: Place the heel of your leg on a stool about 15 inches high. Keep your knee straight. Lean forward, bending at the hips until you feel a mild stretch in the back of your thigh. Make sure you do not roll your shoulders and bend at the waist when doing this or you will stretch your lower back instead. Hold the stretch for 15 to 30 seconds. Repeat 3 times. Repeat the same stretch on your other leg.  Cat and camel: Get  down on your hands and knees. Let your stomach sag, allowing your back to curve downward. Hold this position for 5 seconds. Then arch your back and hold for 5 seconds. Do 3 sets of 10.  Quadriped Arm/Leg Raises: Get down on your hands and knees. Tighten your abdominal muscles to stiffen your spine. While keeping your abdominals tight, raise one arm and the opposite leg away from you. Hold this position for 5 seconds. Lower your arm and leg slowly and alternate sides. Do this 10 times on each side.  Pelvic tilt: Lie on your back with your knees bent and your feet flat on the floor. Tighten your abdominal muscles and push your lower back into the floor. Hold this position for 5 seconds, then relax. Do 3 sets of 10.  Partial curl: Lie on your back with your knees bent and your feet flat on the floor. Tighten your stomach muscles and flatten your back against the floor. Tuck your chin to your chest. With your hands stretched out in front of you, curl your upper body forward until your shoulders clear the floor. Hold this position for 3 seconds. Don't hold your breath. It helps to breathe out as you lift your shoulders up. Relax. Repeat 10 times. Build to 3 sets of 10. To challenge yourself, clasp your hands behind your head and keep your elbows out to the side.  Lower trunk rotation: Lie on your back with your knees bent and  your feet flat on the floor. Tighten your abdominal muscles and push your lower back into the floor. Keeping your shoulders down flat, gently rotate your legs to one side, then the other as far as you can. Repeat 10 to 20 times.  Single knee to chest stretch: Lie on your back with your legs straight out in front of you. Bring one knee up to your chest and grasp the back of your thigh. Pull your knee toward your chest, stretching your buttock muscle. Hold this position for 15 to 30 seconds and return to the starting position. Repeat 3 times on each side.  Double knee to chest: Lie on your  back with your knees bent and your feet flat on the floor. Tighten your abdominal muscles and push your lower back into the floor. Pull both knees up to your chest. Hold for 5 seconds and repeat 10 to 20 times.

## 2018-07-01 LAB — CBC WITH DIFFERENTIAL/PLATELET
Absolute Monocytes: 524 cells/uL (ref 200–950)
Basophils Absolute: 41 cells/uL (ref 0–200)
Basophils Relative: 0.6 %
Eosinophils Absolute: 190 cells/uL (ref 15–500)
Eosinophils Relative: 2.8 %
HCT: 36.5 % (ref 35.0–45.0)
Hemoglobin: 12.2 g/dL (ref 11.7–15.5)
Lymphs Abs: 1401 cells/uL (ref 850–3900)
MCH: 30.3 pg (ref 27.0–33.0)
MCHC: 33.4 g/dL (ref 32.0–36.0)
MCV: 90.8 fL (ref 80.0–100.0)
MPV: 11 fL (ref 7.5–12.5)
Monocytes Relative: 7.7 %
Neutro Abs: 4644 cells/uL (ref 1500–7800)
Neutrophils Relative %: 68.3 %
Platelets: 257 10*3/uL (ref 140–400)
RBC: 4.02 10*6/uL (ref 3.80–5.10)
RDW: 12.1 % (ref 11.0–15.0)
Total Lymphocyte: 20.6 %
WBC: 6.8 10*3/uL (ref 3.8–10.8)

## 2018-07-01 LAB — COMPLETE METABOLIC PANEL WITH GFR
AG Ratio: 1.5 (calc) (ref 1.0–2.5)
ALT: 39 U/L — ABNORMAL HIGH (ref 6–29)
AST: 48 U/L — ABNORMAL HIGH (ref 10–35)
Albumin: 4.2 g/dL (ref 3.6–5.1)
Alkaline phosphatase (APISO): 81 U/L (ref 37–153)
BUN: 12 mg/dL (ref 7–25)
CO2: 27 mmol/L (ref 20–32)
Calcium: 9.2 mg/dL (ref 8.6–10.4)
Chloride: 105 mmol/L (ref 98–110)
Creat: 0.77 mg/dL (ref 0.50–0.99)
GFR, Est African American: 93 mL/min/{1.73_m2} (ref >=60)
GFR, Est Non African American: 80 mL/min/{1.73_m2} (ref >=60)
Globulin: 2.8 g/dL (calc) (ref 1.9–3.7)
Glucose, Bld: 108 mg/dL — ABNORMAL HIGH (ref 65–99)
Potassium: 4.4 mmol/L (ref 3.5–5.3)
Sodium: 142 mmol/L (ref 135–146)
Total Bilirubin: 0.6 mg/dL (ref 0.2–1.2)
Total Protein: 7 g/dL (ref 6.1–8.1)

## 2018-07-01 LAB — HEMOGLOBIN A1C
Hgb A1c MFr Bld: 6.2 % of total Hgb — ABNORMAL HIGH (ref <5.7)
Mean Plasma Glucose: 131 (calc)
eAG (mmol/L): 7.3 (calc)

## 2018-07-01 LAB — LIPID PANEL
Cholesterol: 201 mg/dL — ABNORMAL HIGH (ref <200)
HDL: 37 mg/dL — ABNORMAL LOW (ref >=50)
LDL Cholesterol (Calc): 135 mg/dL (calc) — ABNORMAL HIGH
Non-HDL Cholesterol (Calc): 164 mg/dL (calc) — ABNORMAL HIGH (ref <130)
Total CHOL/HDL Ratio: 5.4 (calc) — ABNORMAL HIGH (ref <5.0)
Triglycerides: 156 mg/dL — ABNORMAL HIGH (ref <150)

## 2018-07-01 LAB — VITAMIN D 25 HYDROXY (VIT D DEFICIENCY, FRACTURES): Vit D, 25-Hydroxy: 14 ng/mL — ABNORMAL LOW (ref 30–100)

## 2018-07-01 LAB — TSH: TSH: 1.12 mIU/L (ref 0.40–4.50)

## 2018-07-03 ENCOUNTER — Encounter: Payer: Self-pay | Admitting: Nurse Practitioner

## 2018-07-20 DIAGNOSIS — R69 Illness, unspecified: Secondary | ICD-10-CM | POA: Diagnosis not present

## 2018-09-08 ENCOUNTER — Telehealth: Payer: Self-pay | Admitting: Nurse Practitioner

## 2018-09-08 NOTE — Telephone Encounter (Signed)
Per patient report, she is not recommended by dentist to start any bisphosphonate therapy at this time.  We will re-evaluate BMD with repeat DEXA in 1-2 years after last to evaluate progression prior to therapy. - Reinforced with patient to continue Calcium and Vitamin D with regular weight bearing activity.

## 2018-09-08 NOTE — Telephone Encounter (Signed)
-----   Message from Galen Manila, NP sent at 06/30/2018  9:08 AM EST ----- Regarding: Fu dental report Fosamax

## 2018-11-05 DIAGNOSIS — H524 Presbyopia: Secondary | ICD-10-CM | POA: Diagnosis not present

## 2018-11-07 DIAGNOSIS — H52223 Regular astigmatism, bilateral: Secondary | ICD-10-CM | POA: Diagnosis not present

## 2018-11-07 DIAGNOSIS — H524 Presbyopia: Secondary | ICD-10-CM | POA: Diagnosis not present

## 2018-11-11 ENCOUNTER — Ambulatory Visit
Admission: RE | Admit: 2018-11-11 | Discharge: 2018-11-11 | Disposition: A | Discharge status: Home / Self Care | Payer: Medicare HMO | Source: Ambulatory Visit | Attending: Nurse Practitioner | Admitting: Nurse Practitioner

## 2018-11-11 ENCOUNTER — Other Ambulatory Visit: Payer: Self-pay

## 2018-11-11 DIAGNOSIS — N6011 Diffuse cystic mastopathy of right breast: Secondary | ICD-10-CM | POA: Diagnosis not present

## 2018-11-11 DIAGNOSIS — N6459 Other signs and symptoms in breast: Secondary | ICD-10-CM

## 2018-12-28 ENCOUNTER — Encounter: Payer: Self-pay | Admitting: Nurse Practitioner

## 2018-12-28 ENCOUNTER — Other Ambulatory Visit: Payer: Self-pay

## 2018-12-28 ENCOUNTER — Ambulatory Visit (INDEPENDENT_AMBULATORY_CARE_PROVIDER_SITE_OTHER): Payer: Medicare HMO | Admitting: Nurse Practitioner

## 2018-12-28 DIAGNOSIS — N3001 Acute cystitis with hematuria: Secondary | ICD-10-CM | POA: Diagnosis not present

## 2018-12-28 DIAGNOSIS — M545 Low back pain, unspecified: Secondary | ICD-10-CM

## 2018-12-28 LAB — POCT URINALYSIS DIPSTICK
Bilirubin, UA: NEGATIVE
Glucose, UA: NEGATIVE
Ketones, UA: NEGATIVE
Leukocytes, UA: NEGATIVE
Nitrite, UA: NEGATIVE
Protein, UA: NEGATIVE
Spec Grav, UA: <=1.005 — AB (ref 1.010–1.025)
Urobilinogen, UA: 0.2 E.U./dL
pH, UA: 5 (ref 5.0–8.0)

## 2018-12-28 MED ORDER — CEPHALEXIN 500 MG PO CAPS: 500.0000 mg | ORAL_CAPSULE | Freq: Three times a day (TID) | ORAL | 0 refills | Status: AC

## 2018-12-28 NOTE — Progress Notes (Signed)
Telemedicine Encounter: Disclosed to patient at start of encounter that we will provide appropriate telemedicine services.  Patient consents to be treated via phone prior to discussion. - Patient is at her home and is accessed via telephone. - Services are provided by Wilhelmina McardleLauren Kaiea Esselman from [facility].  Subjective:    Patient ID: Amy Mcintosh, female    DOB: 1950-07-26, 68 y.o.   MRN: [id number]  Amy Mcintosh is a 68 y.o. female presenting on 12/28/2018 for Back Pain (bilateral back pain. mostly on the left side. x 3 days )  HPI UTI symptoms, back pain Back pain for last 3 days with more pain on LEFT side. Having more pain when getting up out of a chair.  Patient does not note any injury.  Patient works at [employer] and stands for prolonged periods of time 4.5-5 hrs Wed-Fri.  Has done this for weeks, but Fri evening had symptoms.  Patient does not feel this is musculoskeletal in nature.  Patient has concern about possible bladder infection.  Has had several years since last UTI, but symptoms are very similar.  Some increased urination, flank pain.  She is drinking more water due to heat outside and for pain.   - Patient has not had kidney stone in past, but suspected to have had one in mid 1980s.   - Patient has not noted any gross hematuria.    Social History   Tobacco Use  . Smoking status: Never Smoker  . Smokeless tobacco: Never Used  Substance Use Topics  . Alcohol use: No  . Drug use: No    Review of Systems Per HPI unless specifically indicated above     Objective:    There were no vitals taken for this visit.  Wt Readings from Last 3 Encounters:  06/30/18 197 lb 9.6 oz (89.6 kg)  03/17/18 197 lb (89.4 kg)  09/09/17 194 lb 9.6 oz (88.3 kg)    Physical Exam Patient remotely monitored.  Verbal communication appropriate.  Cognition normal.   Results for orders placed or performed in visit on 12/28/18  POCT Urinalysis Dipstick  Result Value Ref Range   Color, UA light yellow    Clarity, UA clear    Glucose, UA Negative Negative   Bilirubin, UA negative    Ketones, UA negative    Spec Grav, UA <=1.005 (A) 1.010 - 1.025   Blood, UA large    pH, UA 5.0 5.0 - 8.0   Protein, UA Negative Negative   Urobilinogen, UA 0.2 0.2 or 1.0 E.U./dL   Nitrite, UA negative    Leukocytes, UA Negative Negative   Appearance     Odor        Assessment & Plan:   Problem List Items Addressed This Visit    None    Visit Diagnoses    Acute low back pain without sciatica, unspecified back pain laterality    -  Primary   Relevant Orders   POCT Urinalysis Dipstick (Completed)   Urine Culture   Acute cystitis with hematuria       Relevant Medications   cephALEXin (KEFLEX) 500 MG capsule   Other Relevant Orders   Urine Culture    Acute cystitis with hematuria.  Cannot fully exclude musculoskeletal cause, however. Pt symptomatic currently with increased urinary frequency, flank pain x 3 days. Currently without systemic signs or symptoms of infection.   - No current risk of concurrent STI.  Plan: 1. START Keflex 500mg  3 times  daily for next 5 days.   - Send Urine culture  2. Provided non-pharm measures for UTI prevention for good hygiene. 3. Drink plenty of fluids and improve hydration over next 1 week. 4. Provided precautions for severe symptoms requiring ED visit to include no urine in 24-48 hours. 5. Followup 2-5 days as needed for worsening or persistent symptoms.     Meds ordered this encounter  Medications  . cephALEXin (KEFLEX) 500 MG capsule    Sig: Take 1 capsule (500 mg total) by mouth 3 (three) times daily for 5 days.    Dispense:  15 capsule    Refill:  0    Order Specific Question:   Supervising Provider    Answer:   Olin Hauser [[id number]]   - Time spent in direct consultation with patient via telemedicine about above concerns: 5 minutes  Follow up plan: Follow-up 5-7 days prn.  Cassell Smiles, DNP, AGPCNP-BC Adult  Gerontology Primary Care Nurse Practitioner [facility] 12/28/2018, 11:36 AM

## 2018-12-30 LAB — URINE CULTURE
MICRO NUMBER:: 730171
SPECIMEN QUALITY:: ADEQUATE

## 2019-03-23 ENCOUNTER — Ambulatory Visit (INDEPENDENT_AMBULATORY_CARE_PROVIDER_SITE_OTHER): Payer: Medicare HMO

## 2019-03-23 VITALS — Ht 65.0 in | Wt 197.0 lb

## 2019-03-23 DIAGNOSIS — Z1231 Encounter for screening mammogram for malignant neoplasm of breast: Secondary | ICD-10-CM | POA: Diagnosis not present

## 2019-03-23 DIAGNOSIS — Z0000 Encounter for general adult medical examination without abnormal findings: Secondary | ICD-10-CM | POA: Diagnosis not present

## 2019-03-23 NOTE — Progress Notes (Signed)
Subjective:   Amy Mcintosh is a 68 y.o. female who presents for Medicare Annual (Subsequent) preventive examination.  This visit is being conducted via phone call  - after an attempt to do on video chat - due to the COVID-19 pandemic. This patient has given me verbal consent via phone to conduct this visit, patient states they are participating from their home address. Some vital signs may be absent or patient reported.   Patient identification: identified by name, DOB, and current address.    Review of Systems:   Cardiac Risk Factors include: advanced age (>30men, >66 women)     Objective:     Vitals: Ht 5\' 5"  (1.651 m)   Wt 197 lb (89.4 kg) Comment: pt reported  BMI 32.78 kg/m   Body mass index is 32.78 kg/m.  Advanced Directives 03/23/2019 03/17/2018  Does Patient Have a Medical Advance Directive? No No  Would patient like information on creating a medical advance directive? Yes (MAU/Ambulatory/Procedural Areas - Information given) No - Patient declined    Tobacco Social History   Tobacco Use  Smoking Status Never Smoker  Smokeless Tobacco Never Used     Counseling given: Not Answered   Clinical Intake:  Pre-visit preparation completed: Yes  Pain : No/denies pain     Nutritional Status: BMI > 30  Obese Nutritional Risks: None Diabetes: No  How often do you need to have someone help you when you read instructions, pamphlets, or other written materials from your doctor or pharmacy?: 1 - Never  Interpreter Needed?: No  Information entered by ::  ,LPN  Past Medical History:  Diagnosis Date  . Arthritis   . GERD (gastroesophageal reflux disease)   . Hx of appendectomy   . Incontinence   . Indigestion   . Sleep apnea   . Vertigo    Past Surgical History:  Procedure Laterality Date  . ABDOMINAL HYSTERECTOMY  1982  . APPENDECTOMY    . CHOLECYSTECTOMY    . KNEE SURGERY Right   . SHOULDER ACROMIOPLASTY    . WRIST SURGERY Right    right and left wrist surgery    Family History  Problem Relation Age of Onset  . Liver disease Father   . Breast cancer Sister   . Bladder Cancer Sister   . Pancreatic cancer Paternal Aunt   . Breast cancer Paternal Aunt   . Breast cancer Sister    Social History   Socioeconomic History  . Marital status: Married    Spouse name: Not on file  . Number of children: Not on file  . Years of education: Not on file  . Highest education level: High school graduate  Occupational History  . Occupation: [phone]    Comment: part time   Social Needs  . Financial resource strain: Not very hard  . Food insecurity    Worry: Never true    Inability: Never true  . Transportation needs    Medical: No    Non-medical: No  Tobacco Use  . Smoking status: Never Smoker  . Smokeless tobacco: Never Used  Substance and Sexual Activity  . Alcohol use: No  . Drug use: No  . Sexual activity: Not on file  Lifestyle  . Physical activity    Days per week: 1 day    Minutes per session: 60 min  . Stress: Not at all  Relationships  . Social connections    Talks on phone: More than three times a week  Gets together: More than three times a week    Attends religious service: More than 4 times per year    Active member of club or organization: No    Attends meetings of clubs or organizations: Never    Relationship status: Married  Other Topics Concern  . Not on file  Social History Narrative   Works 3 times a week   Church 3 times a week     Outpatient Encounter Medications as of 03/23/2019  Medication Sig  . [DISCONTINUED] esomeprazole (NEXIUM) 20 MG capsule Take 1 capsule (20 mg total) by mouth daily at 12 noon. (Patient not taking: Reported on 12/28/2018)  . [DISCONTINUED] oxybutynin (DITROPAN-XL) 5 MG 24 hr tablet Take 1 tablet (5 mg total) by mouth daily. (Patient not taking: Reported on 12/28/2018)   No facility-administered encounter medications on file as of 03/23/2019.     Activities  of Daily Living In your present state of health, do you have any difficulty performing the following activities: 03/23/2019  Hearing? Y  Comment only with a lot of background noise  Vision? N  Comment eyeglasses, goes to Dr.Shade  Difficulty concentrating or making decisions? Y  Comment comes back  Walking or climbing stairs? N  Dressing or bathing? N  Doing errands, shopping? N  Preparing Food and eating ? N  Using the Toilet? N  In the past six months, have you accidently leaked urine? N  Do you have problems with loss of bowel control? N  Managing your Medications? N  Managing your Finances? N  Housekeeping or managing your Housekeeping? N  Some recent data might be hidden    Patient Care Team: Mikey College, NP as PCP - General (Nurse Practitioner)    Assessment:   This is a routine wellness examination for Amy Mcintosh.  Exercise Activities and Dietary recommendations Current Exercise Habits: The patient does not participate in regular exercise at present;Home exercise routine, Type of exercise: walking, Time (Minutes): 60, Frequency (Times/Week): 1, Weekly Exercise (Minutes/Week): 60, Intensity: Mild, Exercise limited by: None identified  Goals    . DIET - INCREASE WATER INTAKE     Recommend drinking at least 6-8 glasses of water a day        Fall Risk: Fall Risk  03/23/2019 03/17/2018  Falls in the past year? 0 No  Number falls in past yr: 0 -  Injury with Fall? 0 -    FALL RISK PREVENTION PERTAINING TO THE HOME:  Any stairs in or around the home? No  If so, are there any without handrails? No   Home free of loose throw rugs in walkways, pet beds, electrical cords, etc? Yes  Adequate lighting in your home to reduce risk of falls? Yes   ASSISTIVE DEVICES UTILIZED TO PREVENT FALLS:  Life alert? No  Use of a cane, walker or w/c? No  Grab bars in the bathroom? No  Shower chair or bench in shower? No  Elevated toilet seat or a handicapped toilet? No    DME ORDERS:  DME order needed?  No   TIMED UP AND GO:  Unable to perform    Depression Screen PHQ 2/9 Scores 03/23/2019 03/17/2018  PHQ - 2 Score 0 0     Cognitive Function MMSE - Mini Mental State Exam 03/17/2018 09/09/2017  Orientation to time 5 5  Orientation to Place 5 4  Registration 3 3  Attention/ Calculation 5 5  Recall 3 3  Language- name 2 objects 2 2  Language-  repeat 1 0  Language- follow 3 step command 3 3  Language- read & follow direction 1 1  Write a sentence 1 1  Copy design 1 1  Total score 30 28        Immunization History  Administered Date(s) Administered  . Tdap 05/28/2007    Qualifies for Shingles Vaccine? Yes  Zostavax completed n/a. Due for Shingrix. Education has been provided regarding the importance of this vaccine. Pt has been advised to call insurance company to determine out of pocket expense. Advised may also receive vaccine at local pharmacy or Health Dept. Verbalized acceptance and understanding.  Tdap: Discussed need for TD/TDAP vaccine, patient verbalized understanding that this is not covered as a preventative with there insurance and to call the office if she develops any new skin injuries, ie: cuts, scrapes, bug bites, or open wounds.  Flu Vaccine: Due for Flu vaccine. Declined   Pneumococcal Vaccine: Due for Pneumococcal vaccine. Declined   Screening Tests Health Maintenance  Topic Date Due  . Hepatitis C Screening  1950-12-18  . INFLUENZA VACCINE  08/25/2019 (Originally 12/26/2018)  . TETANUS/TDAP  03/22/2020 (Originally 05/27/2017)  . PNA vac Low Risk Adult (1 of 2 - PCV13) 03/22/2020 (Originally 02/11/2016)  . COLONOSCOPY  12/12/2019  . MAMMOGRAM  04/28/2020  . DEXA SCAN  Completed    Cancer Screenings:  Colorectal Screening: Completed 12/11/2009. Repeat every 10 years  Mammogram: Completed 04/28/2018. Repeat every year. Ordered.   Bone Density: Completed 04/28/2018.   Lung Cancer Screening: (Low Dose CT Chest  recommended if Age 59-80 years, 30 pack-year currently smoking OR have quit w/in 15years.) does not qualify.   Additional Screening:  Hepatitis C Screening: does qualify  Vision Screening: Recommended annual ophthalmology exams for early detection of glaucoma and other disorders of the eye. Is the patient up to date with their annual eye exam?  Yes  Who is the provider or what is the name of the office in which the pt attends annual eye exams? Dr.Shade   Dental Screening: Recommended annual dental exams for proper oral hygiene  Community Resource Referral:  CRR required this visit?  No       Plan:  I have personally reviewed and addressed the Medicare Annual Wellness questionnaire and have noted the following in the patient's chart:  A. Medical and social history B. Use of alcohol, tobacco or illicit drugs  C. Current medications and supplements D. Functional ability and status E.  Nutritional status F.  Physical activity G. Advance directives H. List of other physicians I.  Hospitalizations, surgeries, and ER visits in previous 12 months J.  Vitals K. Screenings such as hearing and vision if needed, cognitive and depression L. Referrals and appointments   In addition, I have reviewed and discussed with patient certain preventive protocols, quality metrics, and best practice recommendations. A written personalized care plan for preventive services as well as general preventive health recommendations were provided to patient.  Signed,    Collene SchlichterHill,  A, LPN  [id number]/960410/27/2020 Nurse Health Advisor   Nurse Notes: none

## 2019-03-23 NOTE — Patient Instructions (Signed)
Amy Mcintosh , Thank you for taking time to come for your Medicare Wellness Visit. I appreciate your ongoing commitment to your health goals. Please review the following plan we discussed and let me know if I can assist you in the future.   Screening recommendations/referrals: Colonoscopy: completed 2011, due 2021 Mammogram: Please call [phone] to schedule your mammogram.  Bone Density: up to date Recommended yearly ophthalmology/optometry visit for glaucoma screening and checkup Recommended yearly dental visit for hygiene and checkup  Vaccinations: Influenza vaccine: declined Pneumococcal vaccine: declied Tdap vaccine: due now Shingles vaccine: shingrix eligible    Advanced directives: Advance directive discussed with you today. Once this is complete please bring a copy in to our office so we can scan it into your chart.  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  please let us know if you would like these services at any time   Next appointment: 03/28/2020 at 8:30am    Preventive Care 65 Years and Older, Female Preventive care refers to lifestyle choices and visits with your health care provider that can promote health and wellness. What does preventive care include?  A yearly physical exam. This is also called an annual well check.  Dental exams once or twice a year.  Routine eye exams. Ask your health care provider how often you should have your eyes checked.  Personal lifestyle choices, including:  Daily care of your teeth and gums.  Regular physical activity.  Eating a healthy diet.  Avoiding tobacco and drug use.  Limiting alcohol use.  Practicing safe sex.  Taking low-dose aspirin every day.  Taking vitamin and mineral supplements as recommended by your health care provider. What happens during an annual well check? The services and screenings done by your health care provider during your annual well  check will depend on your age, overall health, lifestyle risk factors, and family history of disease. Counseling  Your health care provider may ask you questions about your:  Alcohol use.  Tobacco use.  Drug use.  Emotional well-being.  Home and relationship well-being.  Sexual activity.  Eating habits.  History of falls.  Memory and ability to understand (cognition).  Work and work Statistician.  Reproductive health. Screening  You may have the following tests or measurements:  Height, weight, and BMI.  Blood pressure.  Lipid and cholesterol levels. These may be checked every 5 years, or more frequently if you are over 78 years old.  Skin check.  Lung cancer screening. You may have this screening every year starting at age 6 if you have a 30-pack-year history of smoking and currently smoke or have quit within the past 15 years.  Fecal occult blood test (FOBT) of the stool. You may have this test every year starting at age 67.  Flexible sigmoidoscopy or colonoscopy. You may have a sigmoidoscopy every 5 years or a colonoscopy every 10 years starting at age 10.  Hepatitis C blood test.  Hepatitis B blood test.  Sexually transmitted disease (STD) testing.  Diabetes screening. This is done by checking your blood sugar (glucose) after you have not eaten for a while (fasting). You may have this done every 1-3 years.  Bone density scan. This is done to screen for osteoporosis. You may have this done starting at age 33.  Mammogram. This may be done every 1-2 years. Talk to your health care provider about how often you should have regular mammograms. Talk with your health care provider about your test results,  treatment options, and if necessary, the need for more tests. Vaccines  Your health care provider may recommend certain vaccines, such as:  Influenza vaccine. This is recommended every year.  Tetanus, diphtheria, and acellular pertussis (Tdap, Td) vaccine. You  may need a Td booster every 10 years.  Zoster vaccine. You may need this after age 50.  Pneumococcal 13-valent conjugate (PCV13) vaccine. One dose is recommended after age 53.  Pneumococcal polysaccharide (PPSV23) vaccine. One dose is recommended after age 101. Talk to your health care provider about which screenings and vaccines you need and how often you need them. This information is not intended to replace advice given to you by your health care provider. Make sure you discuss any questions you have with your health care provider. Document Released: 06/09/2015 Document Revised: 01/31/2016 Document Reviewed: 03/14/2015 Elsevier Interactive Patient Education  2017 [employer] Prevention in the Home Falls can cause injuries. They can happen to people of all ages. There are many things you can do to make your home safe and to help prevent falls. What can I do on the outside of my home?  Regularly fix the edges of walkways and driveways and fix any cracks.  Remove anything that might make you trip as you walk through a door, such as a raised step or threshold.  Trim any bushes or trees on the path to your home.  Use bright outdoor lighting.  Clear any walking paths of anything that might make someone trip, such as rocks or tools.  Regularly check to see if handrails are loose or broken. Make sure that both sides of any steps have handrails.  Any raised decks and porches should have guardrails on the edges.  Have any leaves, snow, or ice cleared regularly.  Use sand or salt on walking paths during winter.  Clean up any spills in your garage right away. This includes oil or grease spills. What can I do in the bathroom?  Use night lights.  Install grab bars by the toilet and in the tub and shower. Do not use towel bars as grab bars.  Use non-skid mats or decals in the tub or shower.  If you need to sit down in the shower, use a plastic, non-slip stool.  Keep the floor  dry. Clean up any water that spills on the floor as soon as it happens.  Remove soap buildup in the tub or shower regularly.  Attach bath mats securely with double-sided non-slip rug tape.  Do not have throw rugs and other things on the floor that can make you trip. What can I do in the bedroom?  Use night lights.  Make sure that you have a light by your bed that is easy to reach.  Do not use any sheets or blankets that are too big for your bed. They should not hang down onto the floor.  Have a firm chair that has side arms. You can use this for support while you get dressed.  Do not have throw rugs and other things on the floor that can make you trip. What can I do in the kitchen?  Clean up any spills right away.  Avoid walking on wet floors.  Keep items that you use a lot in easy-to-reach places.  If you need to reach something above you, use a strong step stool that has a grab bar.  Keep electrical cords out of the way.  Do not use floor polish or wax that makes floors  slippery. If you must use wax, use non-skid floor wax.  Do not have throw rugs and other things on the floor that can make you trip. What can I do with my stairs?  Do not leave any items on the stairs.  Make sure that there are handrails on both sides of the stairs and use them. Fix handrails that are broken or loose. Make sure that handrails are as long as the stairways.  Check any carpeting to make sure that it is firmly attached to the stairs. Fix any carpet that is loose or worn.  Avoid having throw rugs at the top or bottom of the stairs. If you do have throw rugs, attach them to the floor with carpet tape.  Make sure that you have a light switch at the top of the stairs and the bottom of the stairs. If you do not have them, ask someone to add them for you. What else can I do to help prevent falls?  Wear shoes that:  Do not have high heels.  Have rubber bottoms.  Are comfortable and fit you  well.  Are closed at the toe. Do not wear sandals.  If you use a stepladder:  Make sure that it is fully opened. Do not climb a closed stepladder.  Make sure that both sides of the stepladder are locked into place.  Ask someone to hold it for you, if possible.  Clearly mark and make sure that you can see:  Any grab bars or handrails.  First and last steps.  Where the edge of each step is.  Use tools that help you move around (mobility aids) if they are needed. These include:  Canes.  Walkers.  Scooters.  Crutches.  Turn on the lights when you go into a dark area. Replace any light bulbs as soon as they burn out.  Set up your furniture so you have a clear path. Avoid moving your furniture around.  If any of your floors are uneven, fix them.  If there are any pets around you, be aware of where they are.  Review your medicines with your doctor. Some medicines can make you feel dizzy. This can increase your chance of falling. Ask your doctor what other things that you can do to help prevent falls. This information is not intended to replace advice given to you by your health care provider. Make sure you discuss any questions you have with your health care provider. Document Released: 03/09/2009 Document Revised: 10/19/2015 Document Reviewed: 06/17/2014 Elsevier Interactive Patient Education  2017 [employer].

## 2019-04-07 DIAGNOSIS — G4733 Obstructive sleep apnea (adult) (pediatric): Secondary | ICD-10-CM | POA: Diagnosis not present

## 2019-04-08 ENCOUNTER — Other Ambulatory Visit: Payer: Self-pay | Admitting: Nurse Practitioner

## 2019-04-09 ENCOUNTER — Telehealth: Payer: Self-pay

## 2019-04-09 DIAGNOSIS — R928 Other abnormal and inconclusive findings on diagnostic imaging of breast: Secondary | ICD-10-CM

## 2019-04-09 NOTE — Telephone Encounter (Signed)
Called [facility] they want ultrasound order [id number] from Dr. Raliegh Ip.

## 2019-04-09 NOTE — Telephone Encounter (Signed)
Patient said that she needs an order sten to [facility] for a 6 month u/s follow up.  They said that it was sent to Lauren but since she is no longer here it must be lost.  Can you please call [facility] and find out what we need to do.  Thank you

## 2019-04-09 NOTE — Telephone Encounter (Signed)
Ordered diagnostic mammo  Nobie Putnam, DO [facility] 04/09/2019, 11:34 AM

## 2019-04-13 ENCOUNTER — Other Ambulatory Visit: Payer: Self-pay | Admitting: Family Medicine

## 2019-04-13 DIAGNOSIS — R928 Other abnormal and inconclusive findings on diagnostic imaging of breast: Secondary | ICD-10-CM

## 2019-04-16 ENCOUNTER — Other Ambulatory Visit: Payer: Self-pay | Admitting: Family Medicine

## 2019-04-30 ENCOUNTER — Ambulatory Visit
Admission: RE | Admit: 2019-04-30 | Discharge: 2019-04-30 | Disposition: A | Discharge status: Home / Self Care | Payer: Medicare HMO | Source: Ambulatory Visit | Attending: Family Medicine | Admitting: Family Medicine

## 2019-04-30 DIAGNOSIS — R928 Other abnormal and inconclusive findings on diagnostic imaging of breast: Secondary | ICD-10-CM

## 2019-04-30 DIAGNOSIS — N6489 Other specified disorders of breast: Secondary | ICD-10-CM | POA: Diagnosis not present

## 2019-05-07 ENCOUNTER — Other Ambulatory Visit: Payer: Self-pay

## 2019-05-07 DIAGNOSIS — G4733 Obstructive sleep apnea (adult) (pediatric): Secondary | ICD-10-CM | POA: Diagnosis not present

## 2019-05-07 DIAGNOSIS — Z20822 Contact with and (suspected) exposure to covid-19: Secondary | ICD-10-CM

## 2019-05-08 LAB — NOVEL CORONAVIRUS, NAA: SARS-CoV-2, NAA: NOT DETECTED

## 2019-05-11 ENCOUNTER — Telehealth: Payer: Self-pay

## 2019-05-11 NOTE — Telephone Encounter (Signed)
The pt called requesting her COVID test result. I informed her that the test came back negative. Pt still remains asymptomatic.

## 2019-05-24 ENCOUNTER — Ambulatory Visit: Payer: Medicare HMO | Admitting: Family Medicine

## 2019-05-24 ENCOUNTER — Other Ambulatory Visit: Payer: Self-pay

## 2019-05-24 DIAGNOSIS — J069 Acute upper respiratory infection, unspecified: Secondary | ICD-10-CM | POA: Diagnosis not present

## 2019-05-24 DIAGNOSIS — Z20828 Contact with and (suspected) exposure to other viral communicable diseases: Secondary | ICD-10-CM | POA: Diagnosis not present

## 2019-06-07 DIAGNOSIS — G4733 Obstructive sleep apnea (adult) (pediatric): Secondary | ICD-10-CM | POA: Diagnosis not present

## 2019-10-15 DIAGNOSIS — H43811 Vitreous degeneration, right eye: Secondary | ICD-10-CM | POA: Diagnosis not present

## 2020-01-18 DIAGNOSIS — G4733 Obstructive sleep apnea (adult) (pediatric): Secondary | ICD-10-CM | POA: Diagnosis not present

## 2020-02-18 DIAGNOSIS — G4733 Obstructive sleep apnea (adult) (pediatric): Secondary | ICD-10-CM | POA: Diagnosis not present

## 2020-02-21 DIAGNOSIS — G4733 Obstructive sleep apnea (adult) (pediatric): Secondary | ICD-10-CM | POA: Diagnosis not present

## 2020-03-07 DIAGNOSIS — H25013 Cortical age-related cataract, bilateral: Secondary | ICD-10-CM | POA: Diagnosis not present

## 2020-03-19 DIAGNOSIS — G4733 Obstructive sleep apnea (adult) (pediatric): Secondary | ICD-10-CM | POA: Diagnosis not present

## 2020-03-23 ENCOUNTER — Telehealth: Payer: Self-pay | Admitting: Family Medicine

## 2020-03-23 NOTE — Telephone Encounter (Signed)
Copied from CRM [id number]. Topic: Medicare AWV >> Mar 23, 2020 10:01 AM Claudette Laws R wrote: Reason for CRM:  No answer unable to leave message.  Need to change AWVS appointment from in office to do by phone. Confirm best number to call c

## 2020-03-28 ENCOUNTER — Ambulatory Visit: Payer: Medicare HMO

## 2020-03-28 ENCOUNTER — Telehealth: Payer: Self-pay

## 2020-03-28 NOTE — Telephone Encounter (Signed)
This nurse attempted to call patient in order to perform scheduled telephonic AWV. Called three times [phone], [phone], and [phone]. They voicemail was full and no messages could be left.

## 2020-05-24 IMAGING — US ULTRASOUND RIGHT BREAST LIMITED
1 series · 12 of 12 positions shown · non-contrast
Comparison: Previous exam(s).

CLINICAL DATA: Patient presents for additional views of the right
breast as follow-up to recent screening exam to evaluate a possible
mass.

EXAM:
DIGITAL DIAGNOSTIC right MAMMOGRAM WITH TOMO
ULTRASOUND right BREAST

[Series 1: ultrasound right breast limited · 0.05mm/px · 12 of 12 slices shown]
[im 1/12]
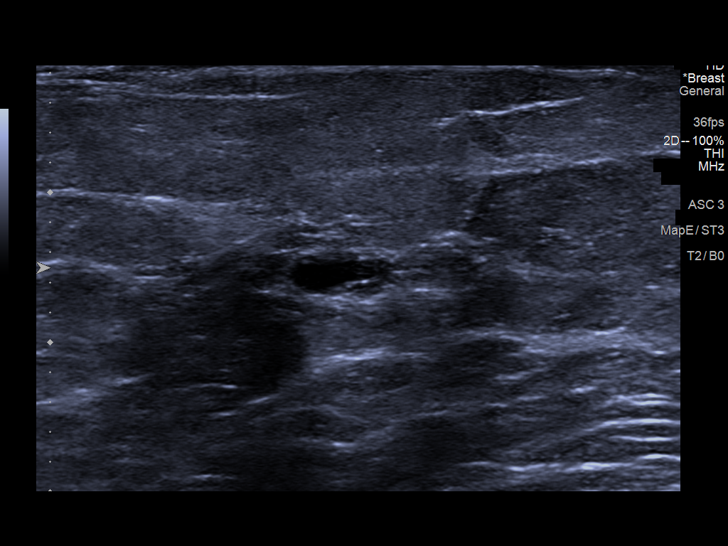
[im 2/12]
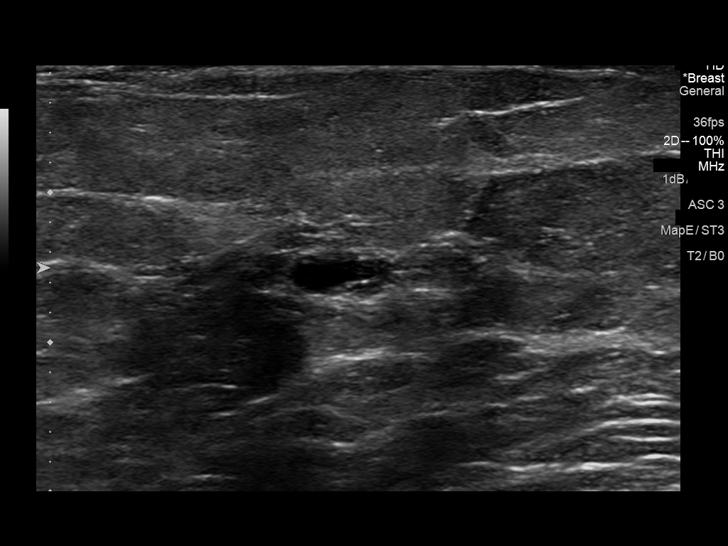
[im 3/12]
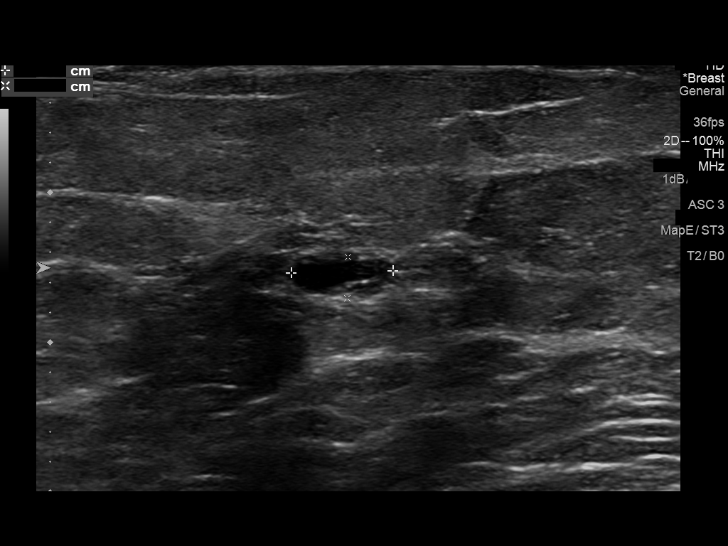
[im 4/12]
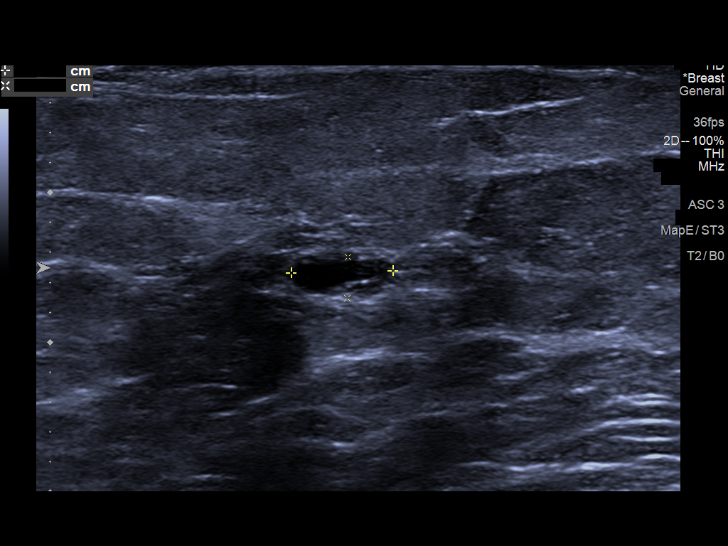
[im 5/12]
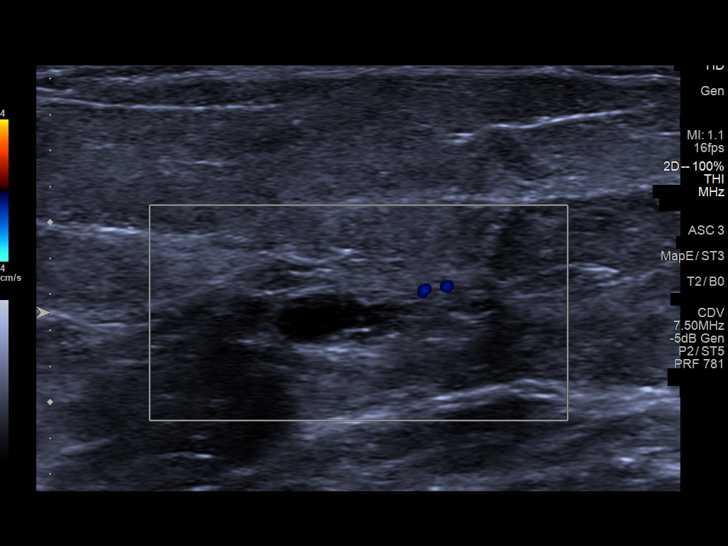
[im 6/12]
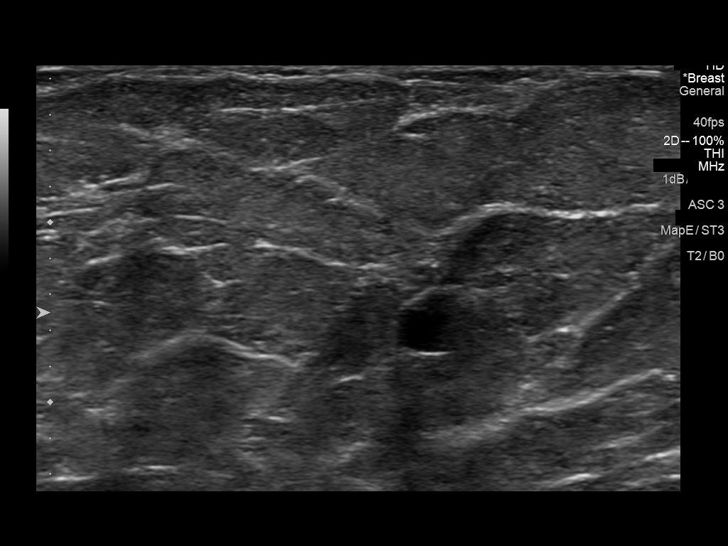
[im 7/12]
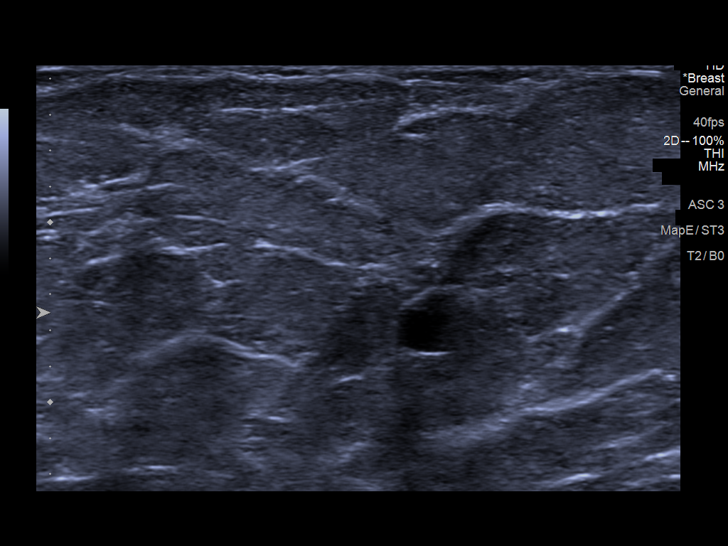
[im 8/12]
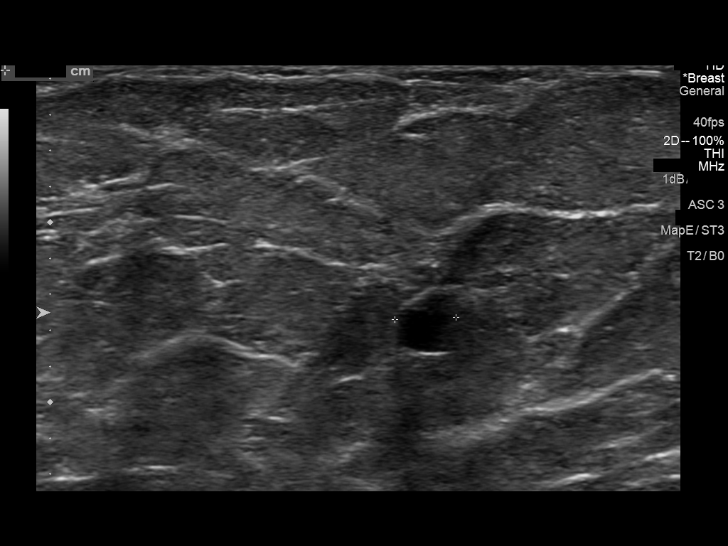
[im 9/12]
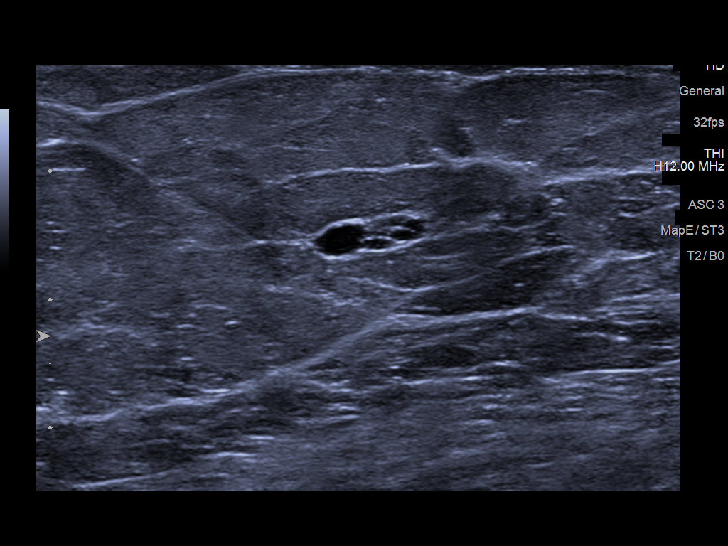
[im 10/12]
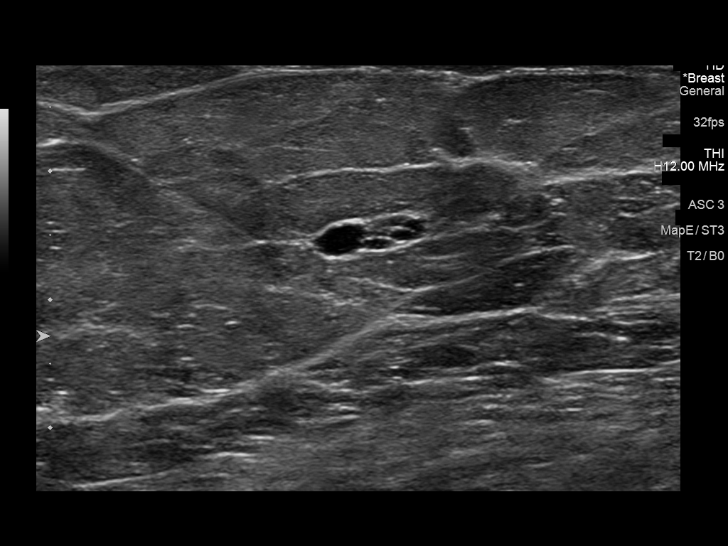
[im 11/12]
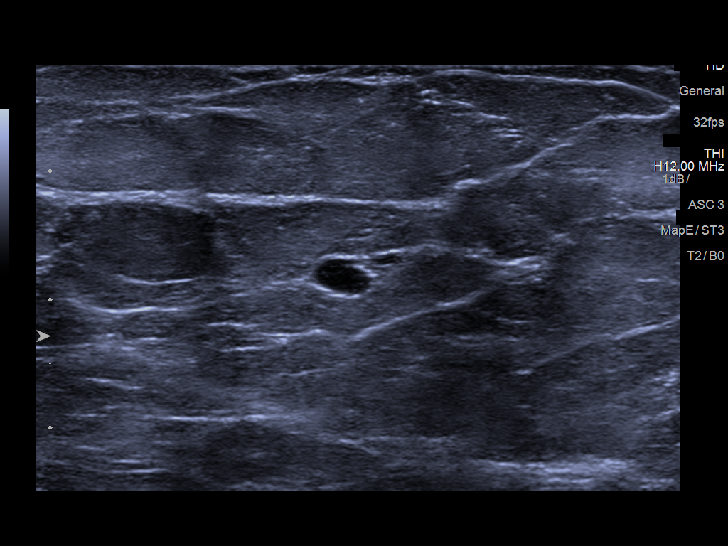
[im 12/12]
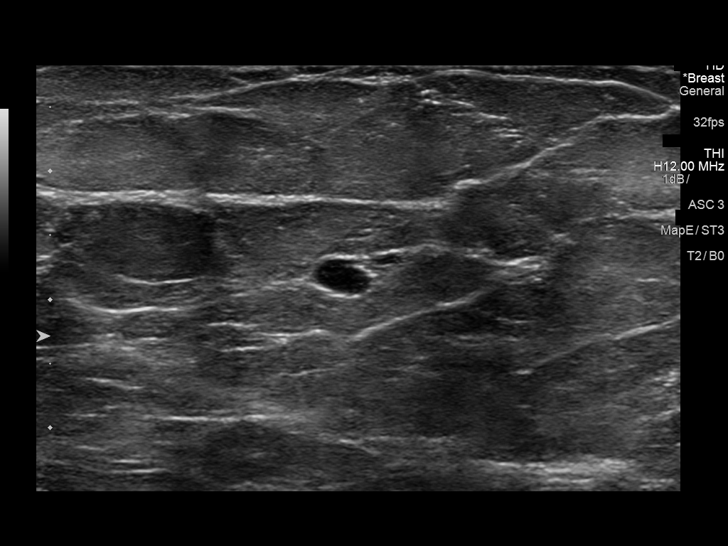

[12 of 12 positions shown; findings below may reference images not displayed]

ACR Breast Density Category b: There are scattered areas of
fibroglandular density.
FINDINGS: Additional images demonstrate an oval predominately circumscribed
mass over the inner lower right breast. Remainder of the exam is
unchanged.

Targeted ultrasound is performed, showing an oval circumscribed hypo
to anechoic mass over the [DATE] position of the right breast 3 cm
from the nipple corresponding to the mammographic finding. This
measures 3 x 3 x 7 mm and has an appearance suggesting a benign
process such as clustered microcysts.
IMPRESSION: Probable benign mass over the [DATE] position of the right breast 3 cm
from the nipple measuring 3 x 3 x 7 mm.

RECOMMENDATION:
Recommend a six-month follow-up diagnostic right breast ultrasound
to document stability of this probable benign finding.

I have discussed the findings and recommendations with the patient.
Results were also provided in writing at the conclusion of the
visit. If applicable, a reminder letter will be sent to the patient
regarding the next appointment.

BI-RADS CATEGORY  3: Probably benign.

## 2020-10-02 IMAGING — MG DIGITAL DIAGNOSTIC UNILATERAL RIGHT MAMMOGRAM WITH TOMO AND CAD
6 series · 6 of 18 positions shown · non-contrast
Comparison: Previous exam(s).

CLINICAL DATA: Patient presents for additional views of the right
breast as follow-up to recent screening exam to evaluate a possible
mass.

EXAM:
DIGITAL DIAGNOSTIC right MAMMOGRAM WITH TOMO
ULTRASOUND right BREAST

[R CC synth-2D]
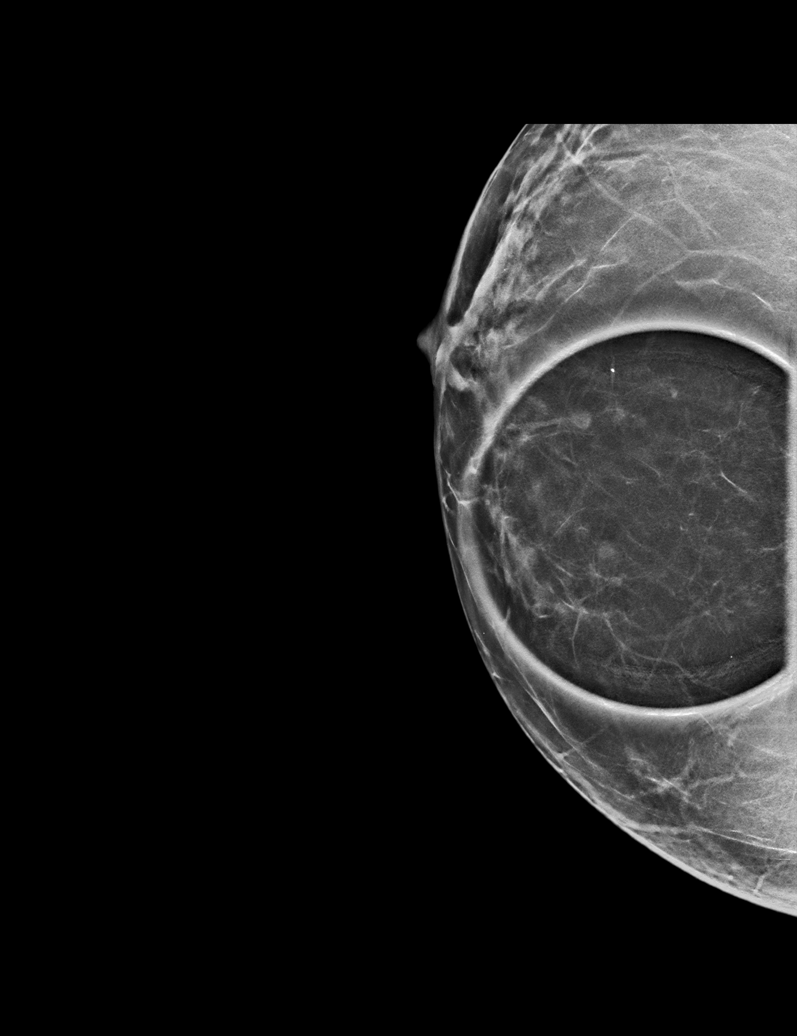

[R ML synth-2D]
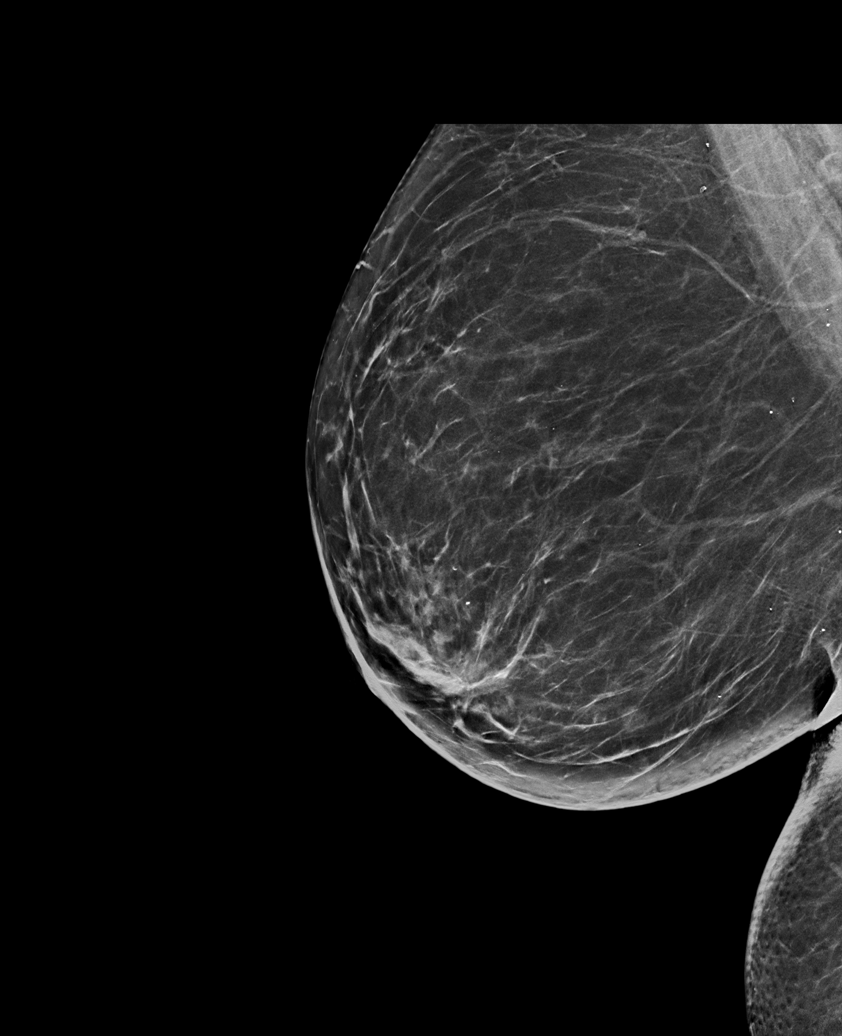

[R MLO synth-2D]
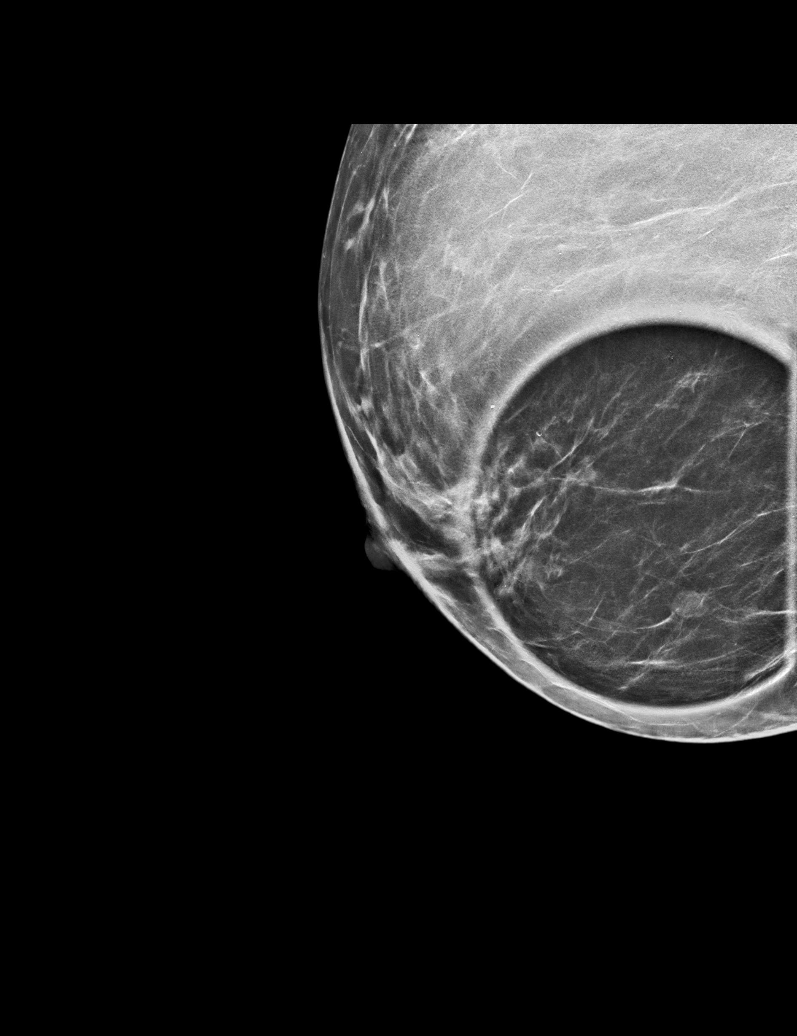

[R MLO tomo · tomo slice 30/59.0]
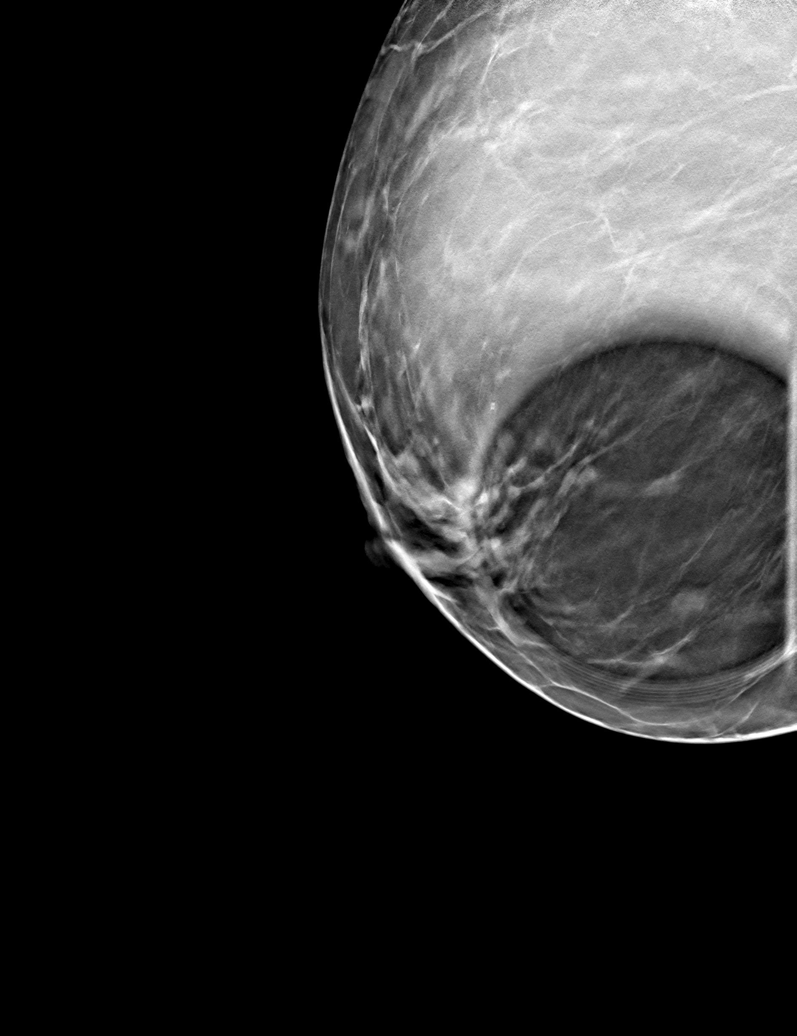

[R CC tomo · tomo slice 27/53.0]
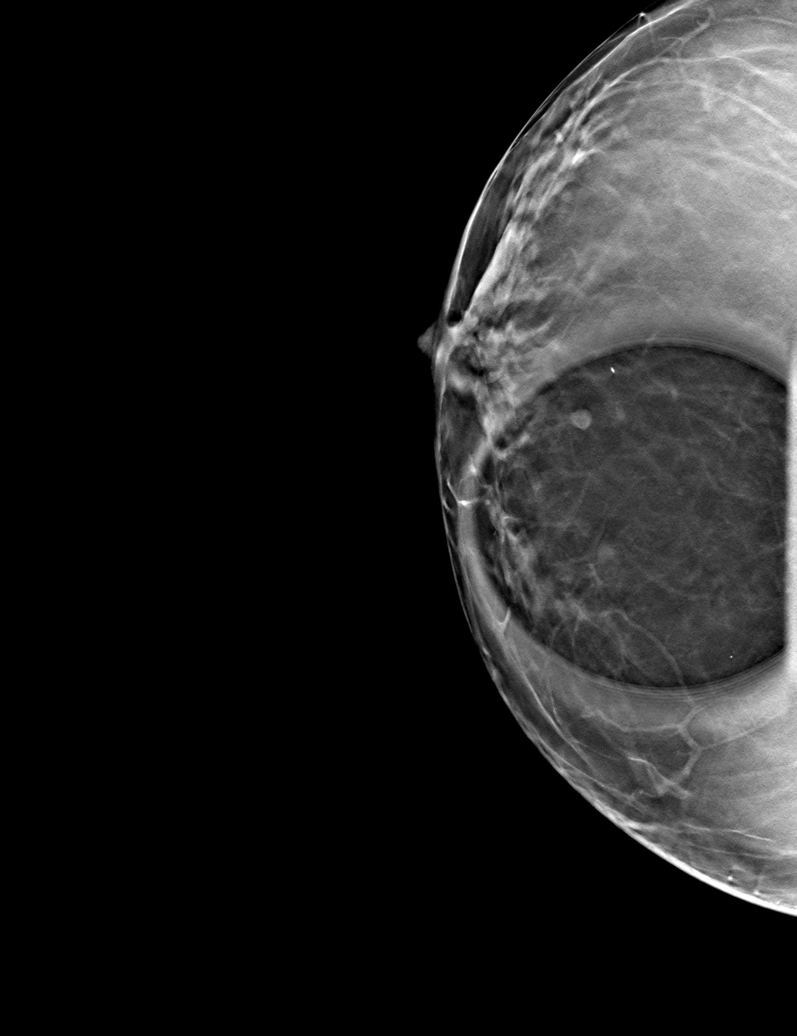

[R ML tomo · tomo slice 39/78.0]
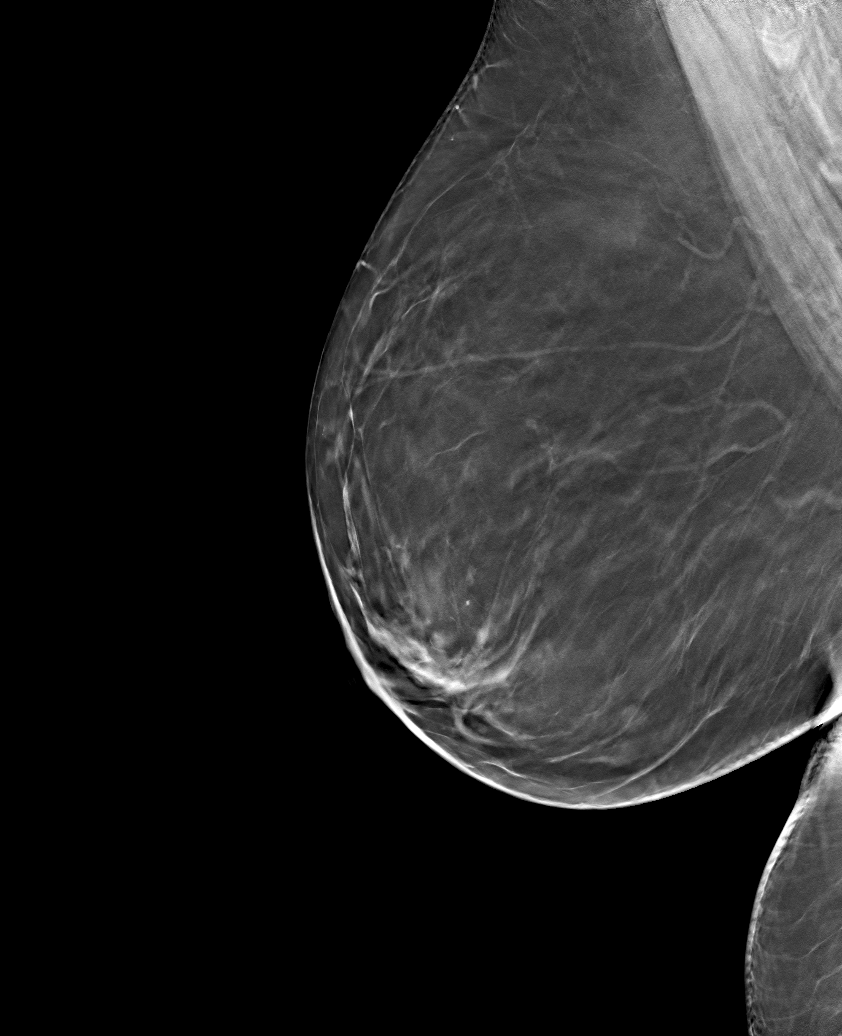

[6 of 18 positions shown; findings below may reference images not displayed]

ACR Breast Density Category b: There are scattered areas of
fibroglandular density.
FINDINGS: Additional images demonstrate an oval predominately circumscribed
mass over the inner lower right breast. Remainder of the exam is
unchanged.

Targeted ultrasound is performed, showing an oval circumscribed hypo
to anechoic mass over the [DATE] position of the right breast 3 cm
from the nipple corresponding to the mammographic finding. This
measures 3 x 3 x 7 mm and has an appearance suggesting a benign
process such as clustered microcysts.
IMPRESSION: Probable benign mass over the [DATE] position of the right breast 3 cm
from the nipple measuring 3 x 3 x 7 mm.

RECOMMENDATION:
Recommend a six-month follow-up diagnostic right breast ultrasound
to document stability of this probable benign finding.

I have discussed the findings and recommendations with the patient.
Results were also provided in writing at the conclusion of the
visit. If applicable, a reminder letter will be sent to the patient
regarding the next appointment.

BI-RADS CATEGORY  3: Probably benign.

## 2020-10-21 DIAGNOSIS — Z20822 Contact with and (suspected) exposure to covid-19: Secondary | ICD-10-CM | POA: Diagnosis not present

## 2020-10-21 DIAGNOSIS — J069 Acute upper respiratory infection, unspecified: Secondary | ICD-10-CM | POA: Diagnosis not present

## 2020-10-21 DIAGNOSIS — R070 Pain in throat: Secondary | ICD-10-CM | POA: Diagnosis not present

## 2021-03-28 DIAGNOSIS — Z20822 Contact with and (suspected) exposure to covid-19: Secondary | ICD-10-CM | POA: Diagnosis not present

## 2021-03-28 DIAGNOSIS — R059 Cough, unspecified: Secondary | ICD-10-CM | POA: Diagnosis not present

## 2021-03-28 DIAGNOSIS — R070 Pain in throat: Secondary | ICD-10-CM | POA: Diagnosis not present

## 2021-04-04 IMAGING — US US BREAST*R* LIMITED INC AXILLA
1 series · 6 of 6 positions shown · non-contrast
Comparison: Previous exam(s).

CLINICAL DATA: Follow-up of probably benign right breast 3:30
o'clock mass.

EXAM:
ULTRASOUND OF THE RIGHT BREAST

[Series 1: us breast*right* limited inc axilla · 0.05mm/px · 6 of 6 slices shown]
[im 1/6]
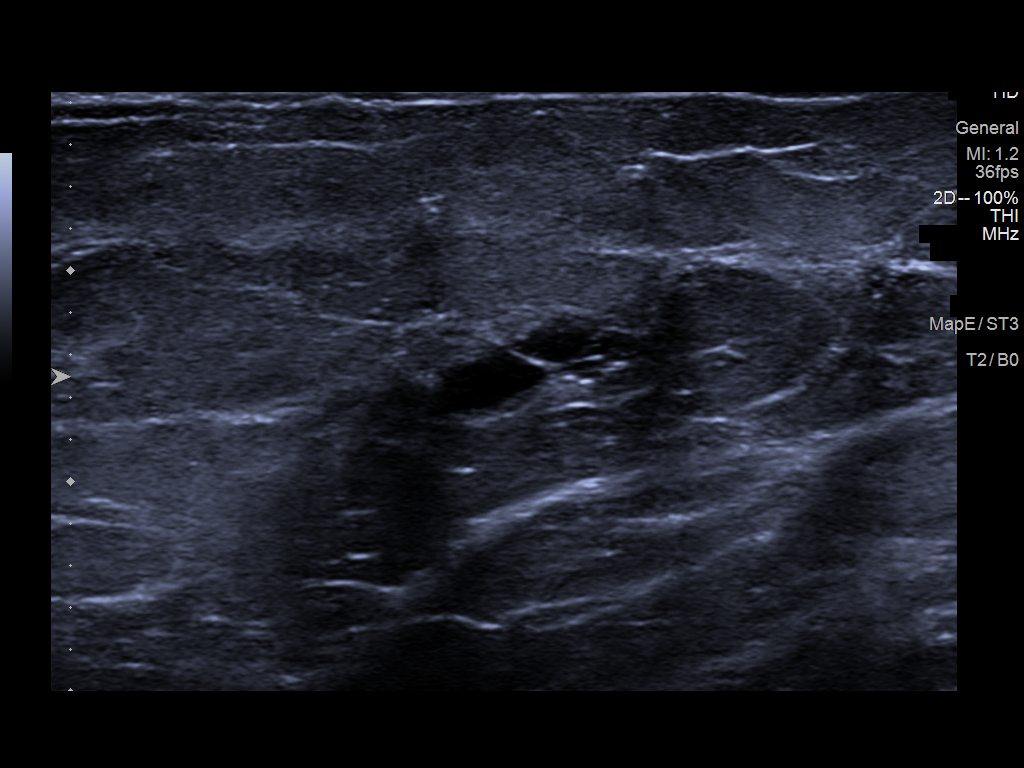
[im 2/6]
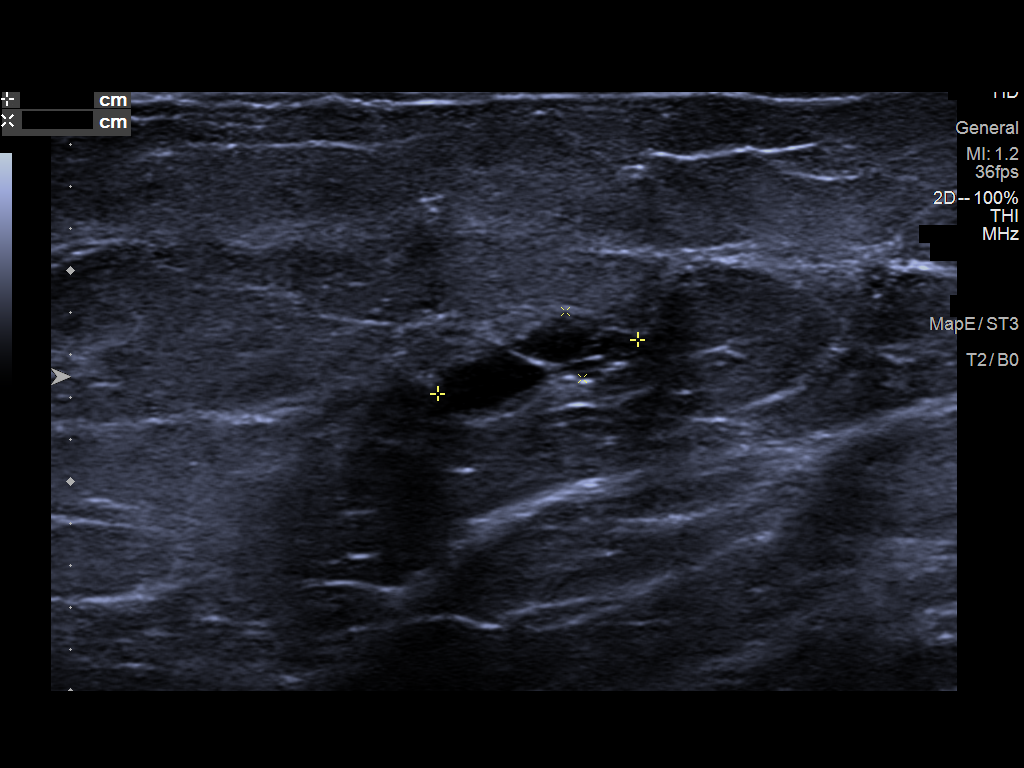
[im 3/6]
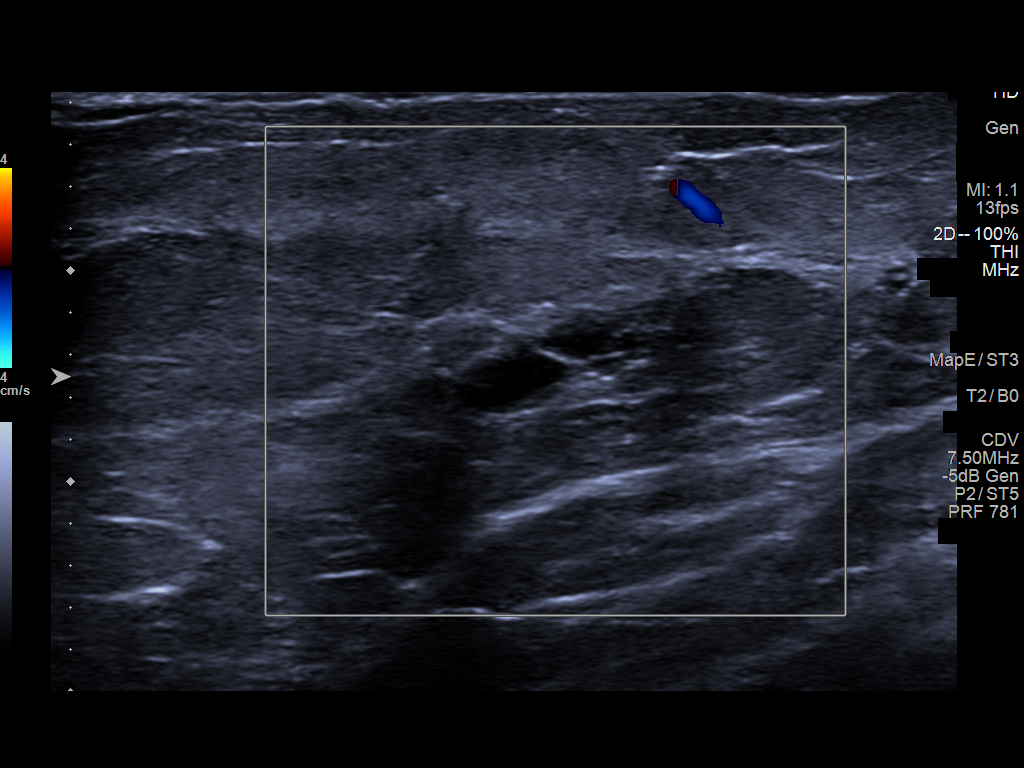
[im 4/6]
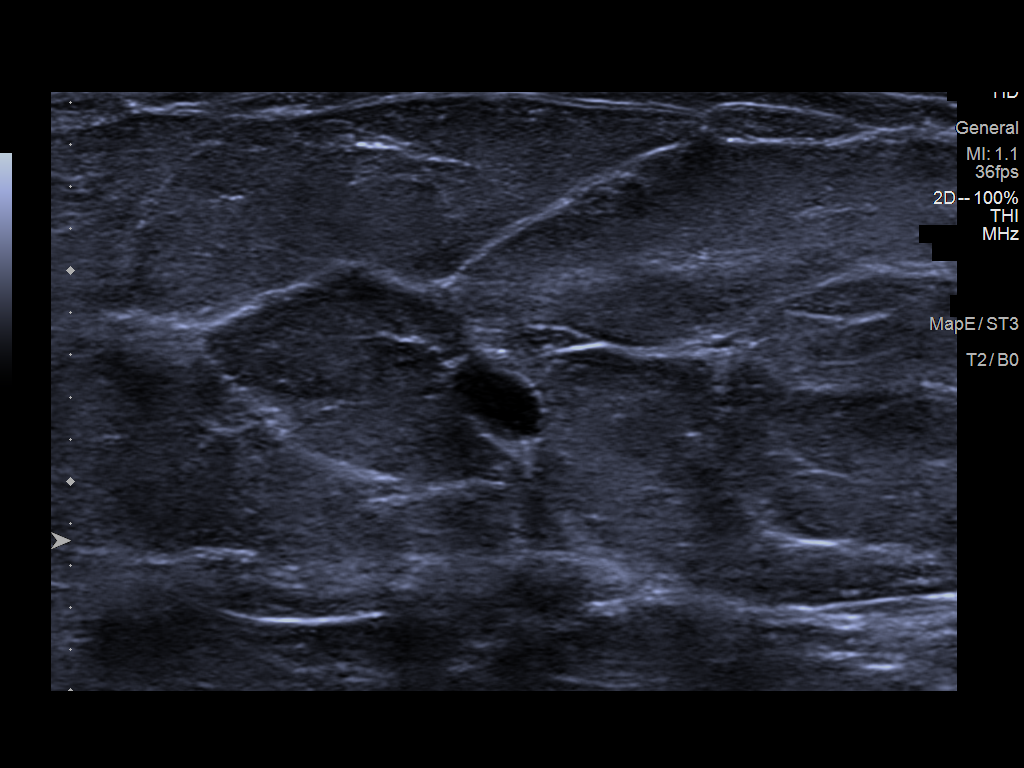
[im 5/6]
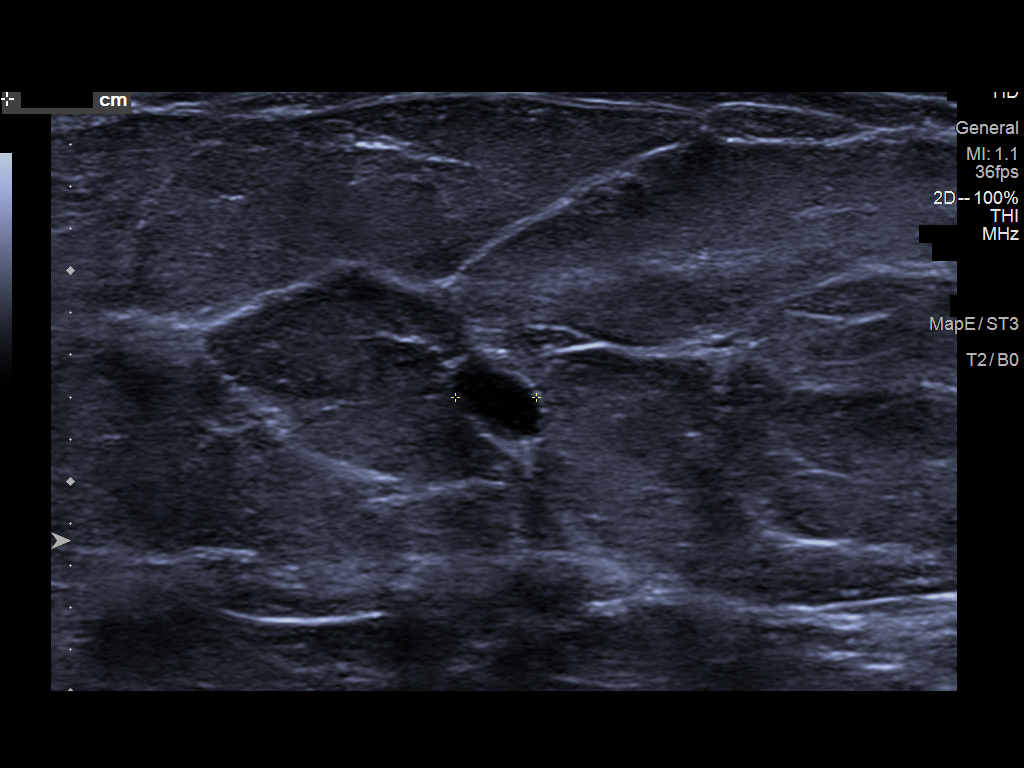
[im 6/6]
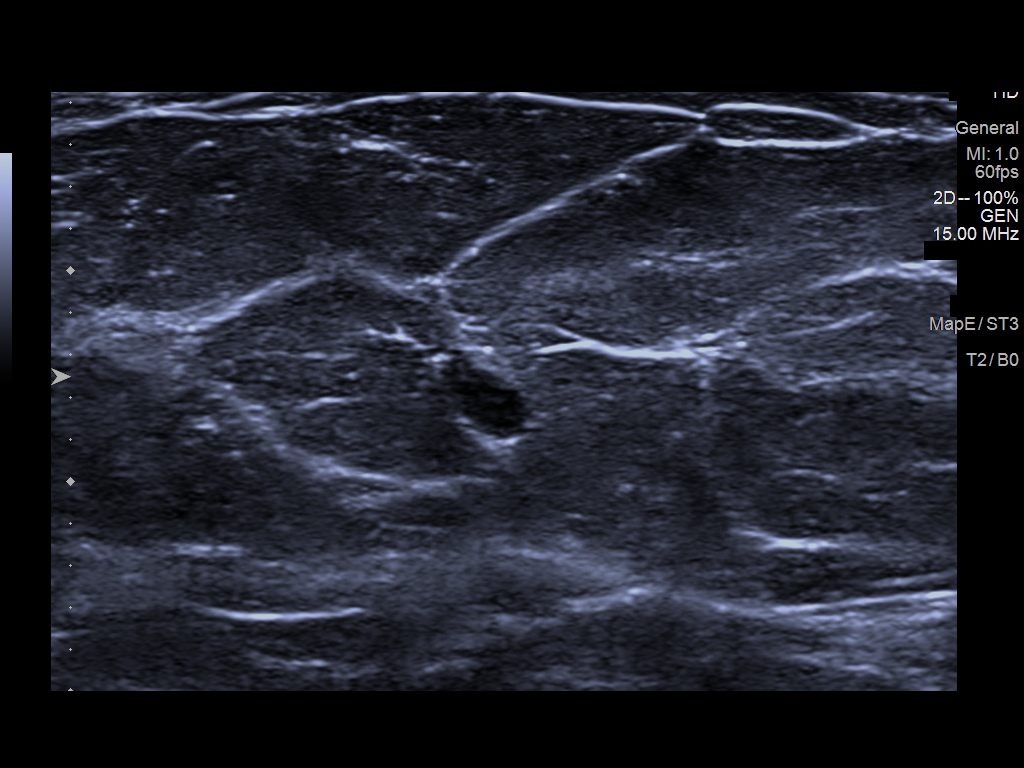

[6 of 6 positions shown; findings below may reference images not displayed]

FINDINGS: Targeted ultrasound is performed, showing right breast 3:30 o'clock
3 cm from the nipple hypoechoic to anechoic horizontally oriented
septated mass which measures 1.0 x 0.3 x 0.4 cm. Although this
finding has slightly increased in size, the appearance is suggestive
of a cluster of benign cysts.
IMPRESSION: Right breast 3:30 o'clock probable cluster of cysts.

RECOMMENDATION:
Bilateral diagnostic mammogram and targeted right breast ultrasound
in 6 months.

I have discussed the findings and recommendations with the patient.
Results were also provided in writing at the conclusion of the
visit. If applicable, a reminder letter will be sent to the patient
regarding the next appointment.

BI-RADS CATEGORY  3: Probably benign.

## 2021-09-21 IMAGING — US US BREAST*R* LIMITED INC AXILLA
1 series · 6 of 6 positions shown · non-contrast
Comparison: Previous exam(s).

CLINICAL DATA: Short-term follow-up for a likely benign right
breast mass.

EXAM:
DIGITAL DIAGNOSTIC BILATERAL MAMMOGRAM WITH CAD AND TOMO
ULTRASOUND RIGHT BREAST

[Series 1: us breast*right* limited inc axilla · 0.05mm/px · 6 of 6 slices shown]
[im 1/6]
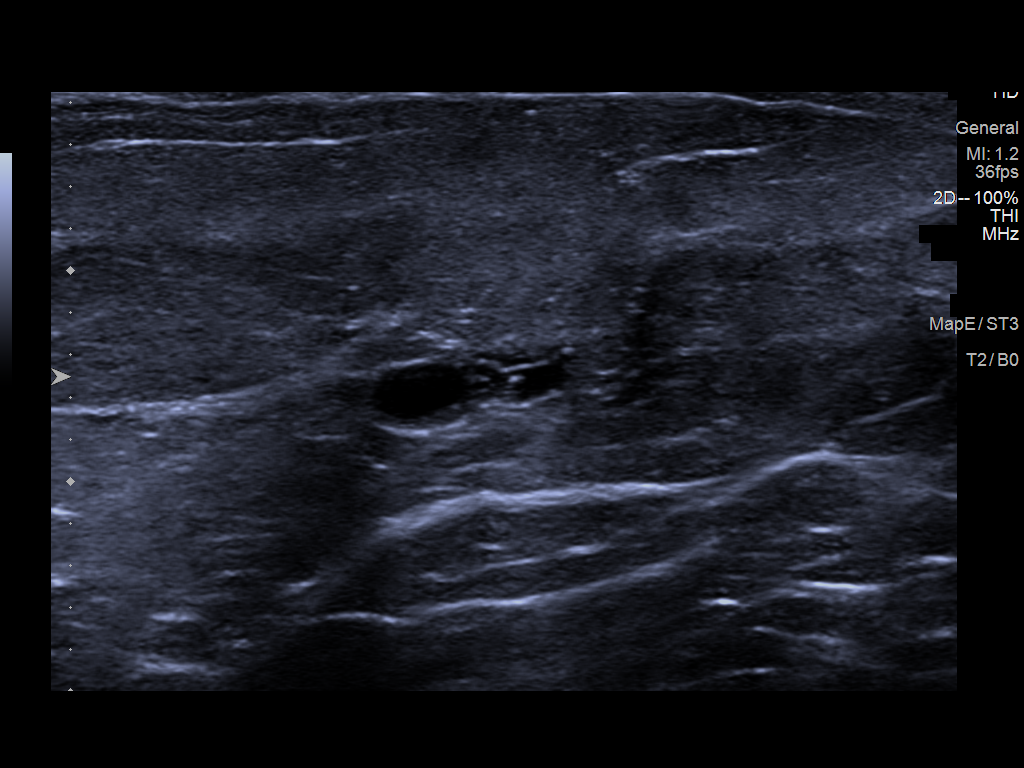
[im 2/6]
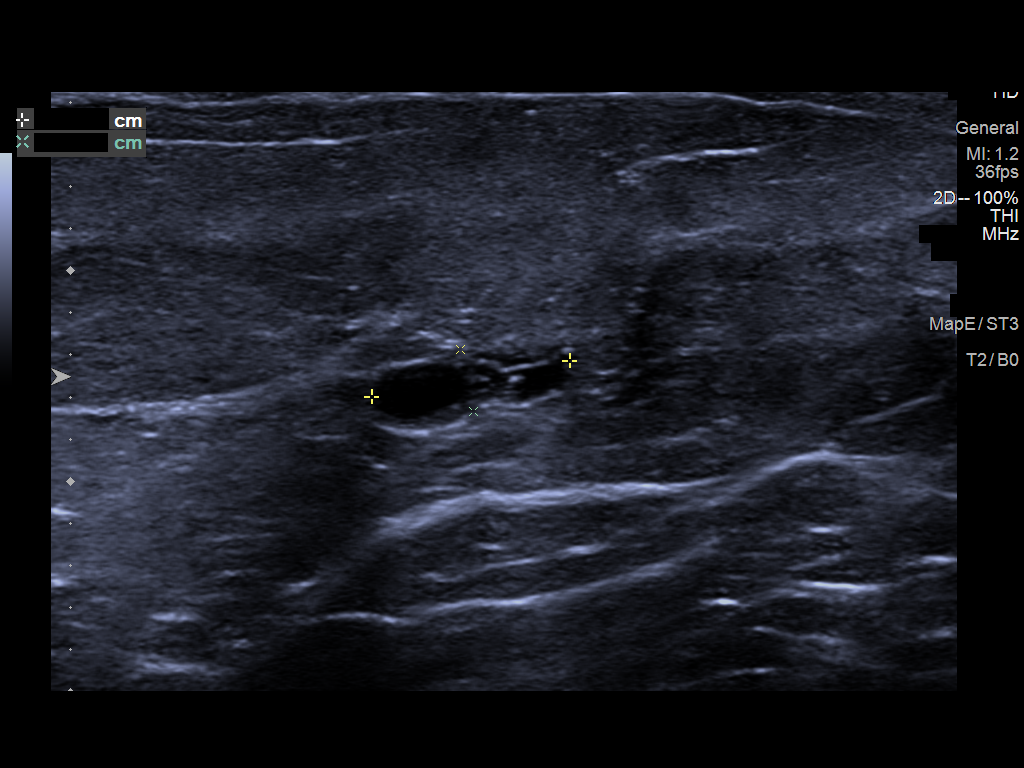
[im 3/6]
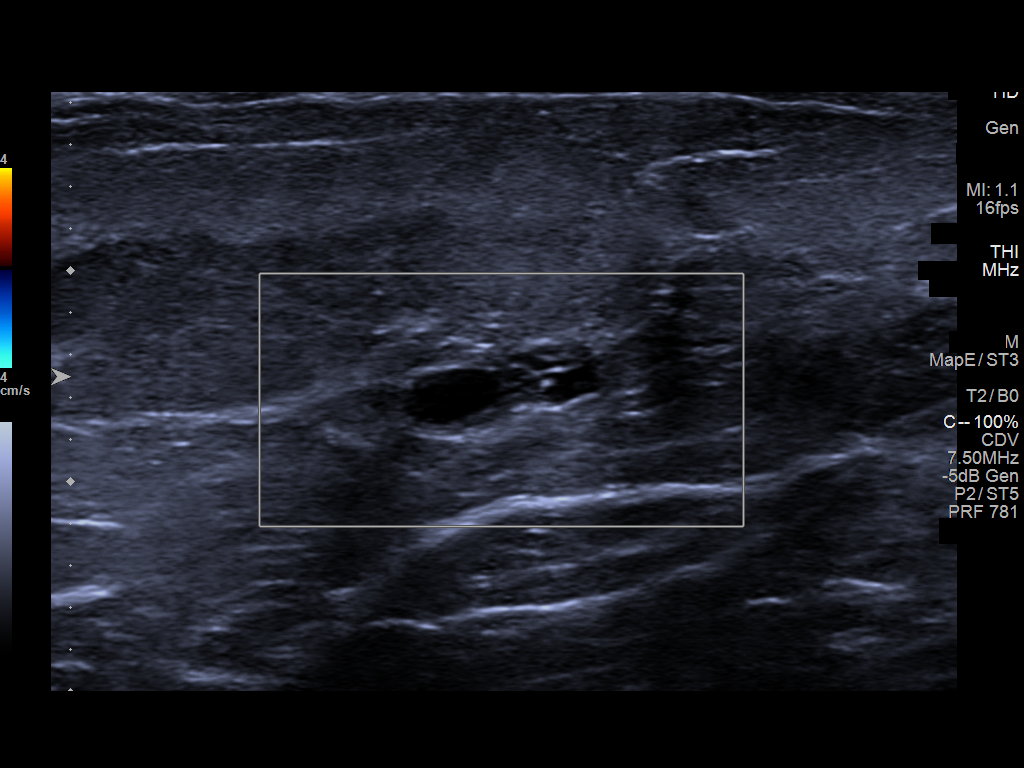
[im 4/6]
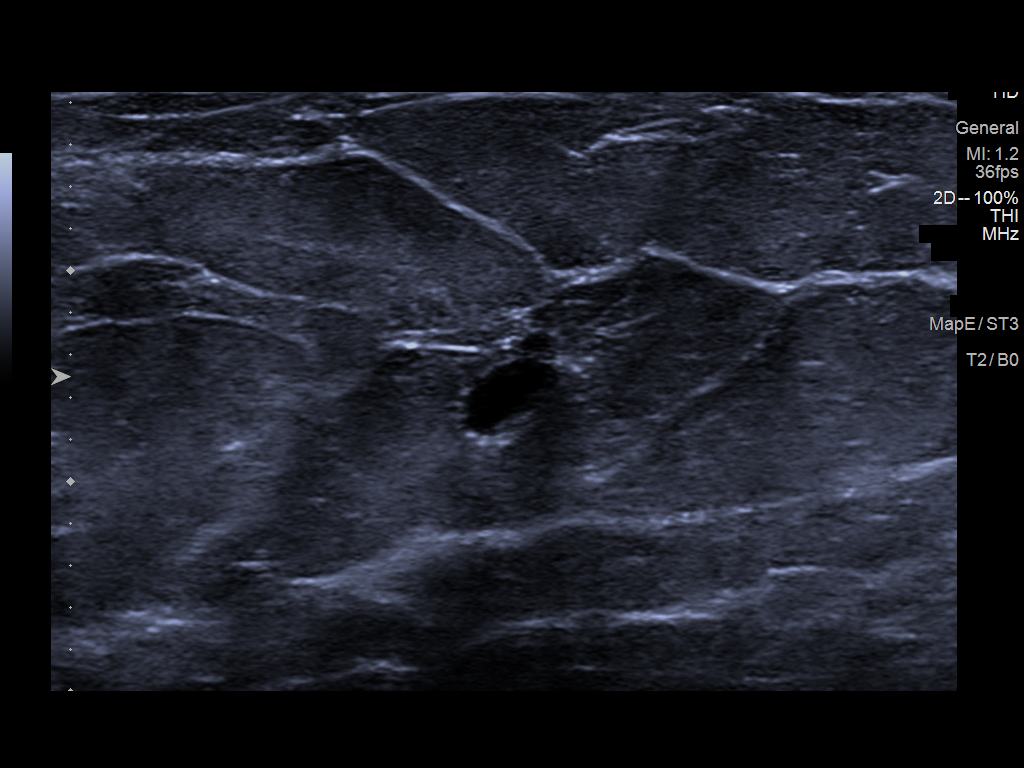
[im 5/6]
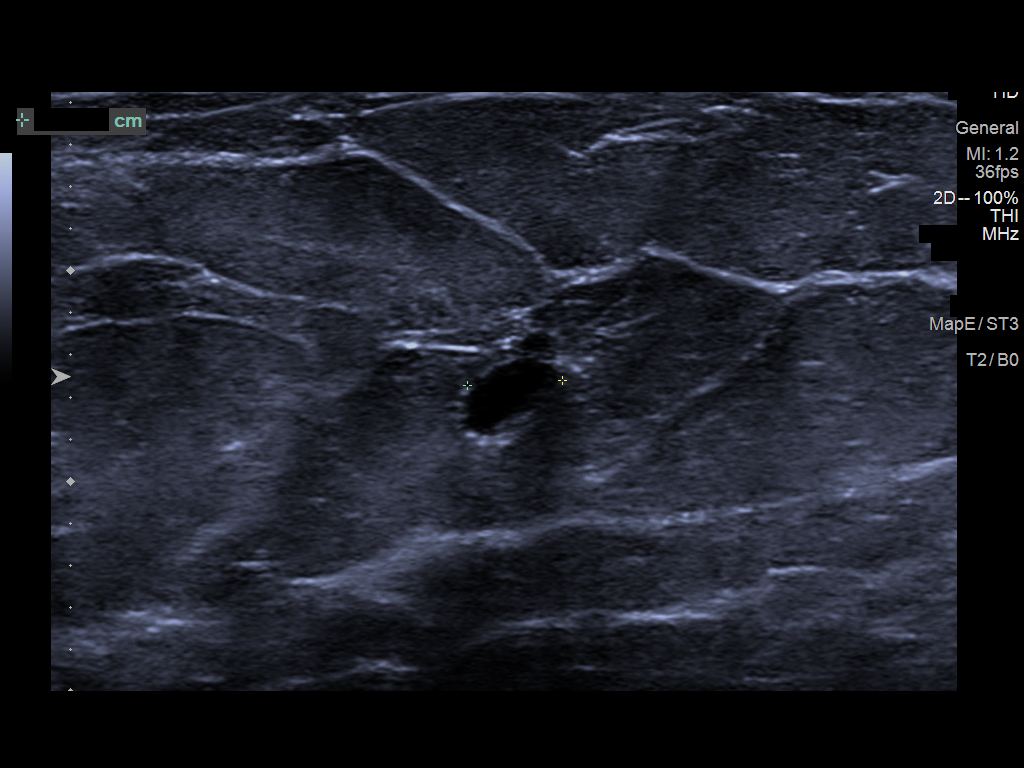
[im 6/6]
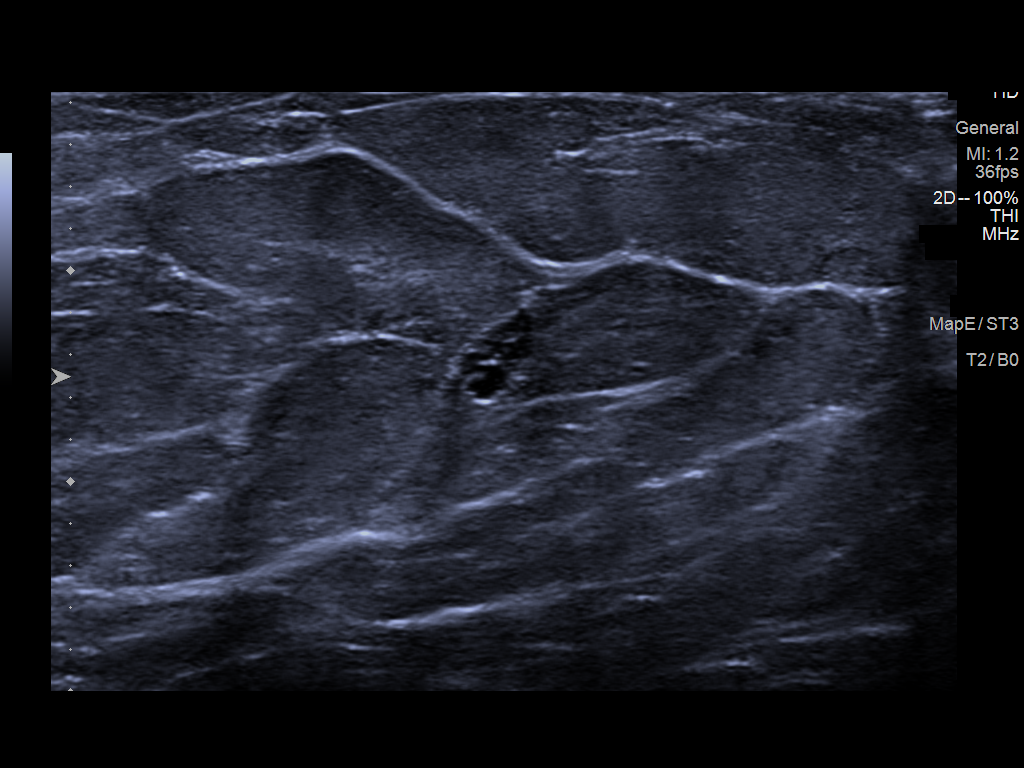

[6 of 6 positions shown; findings below may reference images not displayed]

ACR Breast Density Category b: There are scattered areas of
fibroglandular density.
FINDINGS: The mass in the lower-inner quadrant of the right breast is
mammographically stable. No suspicious calcifications, masses or
areas of distortion are seen in the bilateral breasts.

Mammographic images were processed with CAD.

The right breast mass at [DATE], 3 cm from the nipple is stable
measuring 1.0 x 0.3 x 0.5 cm, previously measuring 1.0 x 0.3 x
cm.
IMPRESSION: 1.  The likely benign mass in the right breast at [DATE] is stable.

2.  No mammographic evidence of malignancy in the bilateral breasts.

RECOMMENDATION:
Bilateral diagnostic mammogram and right breast ultrasound in 1
year.

I have discussed the findings and recommendations with the patient.
If applicable, a reminder letter will be sent to the patient
regarding the next appointment.

BI-RADS CATEGORY  3: Probably benign.

## 2022-08-23 DIAGNOSIS — H524 Presbyopia: Secondary | ICD-10-CM | POA: Diagnosis not present

## 2023-06-23 DIAGNOSIS — J069 Acute upper respiratory infection, unspecified: Secondary | ICD-10-CM | POA: Diagnosis not present

## 2023-07-21 DIAGNOSIS — L509 Urticaria, unspecified: Secondary | ICD-10-CM | POA: Diagnosis not present

## 2023-09-09 ENCOUNTER — Other Ambulatory Visit: Payer: Self-pay | Admitting: Family Medicine

## 2023-09-09 DIAGNOSIS — M810 Age-related osteoporosis without current pathological fracture: Secondary | ICD-10-CM

## 2023-09-22 ENCOUNTER — Ambulatory Visit
Admission: RE | Admit: 2023-09-22 | Discharge: 2023-09-22 | Disposition: A | Discharge status: Home / Self Care | Source: Ambulatory Visit | Attending: Family Medicine | Admitting: Family Medicine

## 2023-09-22 DIAGNOSIS — M810 Age-related osteoporosis without current pathological fracture: Secondary | ICD-10-CM | POA: Insufficient documentation

## 2023-09-24 ENCOUNTER — Other Ambulatory Visit: Payer: Self-pay | Admitting: Family Medicine

## 2023-09-24 DIAGNOSIS — Z1231 Encounter for screening mammogram for malignant neoplasm of breast: Secondary | ICD-10-CM

## 2023-09-24 DIAGNOSIS — N63 Unspecified lump in unspecified breast: Secondary | ICD-10-CM

## 2024-06-04 ENCOUNTER — Ambulatory Visit: Attending: Otolaryngology

## 2024-06-04 DIAGNOSIS — G4733 Obstructive sleep apnea (adult) (pediatric): Secondary | ICD-10-CM | POA: Insufficient documentation
